# Patient Record
Sex: Female | Born: 1960 | Race: White | Hispanic: No | Marital: Married | Smoking: Former smoker
Health system: Southern US, Community
[De-identification: ages and names within clinical notes are randomized; demographics above are authoritative.]

## PROBLEM LIST (undated history)

## (undated) DIAGNOSIS — J449 Chronic obstructive pulmonary disease, unspecified: Secondary | ICD-10-CM

## (undated) DIAGNOSIS — E7800 Pure hypercholesterolemia, unspecified: Secondary | ICD-10-CM

## (undated) DIAGNOSIS — R599 Enlarged lymph nodes, unspecified: Secondary | ICD-10-CM

## (undated) DIAGNOSIS — I4891 Unspecified atrial fibrillation: Secondary | ICD-10-CM

## (undated) DIAGNOSIS — R918 Other nonspecific abnormal finding of lung field: Secondary | ICD-10-CM

## (undated) DIAGNOSIS — T7840XA Allergy, unspecified, initial encounter: Secondary | ICD-10-CM

## (undated) HISTORY — PX: BREAST SURGERY: SHX581

## (undated) HISTORY — DX: Enlarged lymph nodes, unspecified: R59.9

## (undated) HISTORY — DX: Other nonspecific abnormal finding of lung field: R91.8

## (undated) HISTORY — PX: ABDOMINAL HYSTERECTOMY: SHX81

## (undated) HISTORY — PX: DILATION AND CURETTAGE OF UTERUS: SHX78

## (undated) HISTORY — DX: Unspecified atrial fibrillation: I48.91

## (undated) HISTORY — DX: Pure hypercholesterolemia, unspecified: E78.00

## (undated) HISTORY — DX: Allergy, unspecified, initial encounter: T78.40XA

---

## 1998-08-01 ENCOUNTER — Other Ambulatory Visit: Admission: RE | Admit: 1998-08-01 | Discharge: 1998-08-01 | Payer: Self-pay | Admitting: Family Medicine

## 1998-11-30 ENCOUNTER — Ambulatory Visit (HOSPITAL_COMMUNITY): Admission: RE | Admit: 1998-11-30 | Discharge: 1998-11-30 | Payer: Self-pay | Admitting: Obstetrics & Gynecology

## 2000-09-05 ENCOUNTER — Other Ambulatory Visit: Admission: RE | Admit: 2000-09-05 | Discharge: 2000-09-05 | Payer: Self-pay | Admitting: Obstetrics & Gynecology

## 2001-09-10 ENCOUNTER — Other Ambulatory Visit: Admission: RE | Admit: 2001-09-10 | Discharge: 2001-09-10 | Payer: Self-pay | Admitting: Obstetrics & Gynecology

## 2002-10-08 ENCOUNTER — Other Ambulatory Visit: Admission: RE | Admit: 2002-10-08 | Discharge: 2002-10-08 | Payer: Self-pay | Admitting: Obstetrics & Gynecology

## 2003-10-18 ENCOUNTER — Other Ambulatory Visit: Admission: RE | Admit: 2003-10-18 | Discharge: 2003-10-18 | Payer: Self-pay | Admitting: Obstetrics & Gynecology

## 2006-04-05 ENCOUNTER — Inpatient Hospital Stay (HOSPITAL_COMMUNITY): Admission: AD | Admit: 2006-04-05 | Discharge: 2006-04-06 | Payer: Self-pay | Admitting: Obstetrics & Gynecology

## 2006-04-07 ENCOUNTER — Inpatient Hospital Stay (HOSPITAL_COMMUNITY): Admission: AD | Admit: 2006-04-07 | Discharge: 2006-04-07 | Payer: Self-pay | Admitting: Obstetrics and Gynecology

## 2010-10-31 ENCOUNTER — Other Ambulatory Visit: Payer: Self-pay | Admitting: Obstetrics & Gynecology

## 2012-08-14 ENCOUNTER — Other Ambulatory Visit: Payer: Self-pay | Admitting: Gastroenterology

## 2015-02-23 ENCOUNTER — Telehealth: Payer: Self-pay | Admitting: Acute Care

## 2015-02-23 ENCOUNTER — Encounter: Payer: Self-pay | Admitting: Acute Care

## 2015-02-23 NOTE — Telephone Encounter (Signed)
I called to schedule Ms. Laurie Schmidt for a lung cancer screening at the request of Manon Hilding, PA/ Dr. Hyacinth Meeker at [facility]. There was no answer. I left a message with my contact information asking that she return my call. I will await her return call.

## 2015-02-24 ENCOUNTER — Other Ambulatory Visit: Payer: Self-pay | Admitting: Acute Care

## 2015-02-24 DIAGNOSIS — Z87891 Personal history of nicotine dependence: Secondary | ICD-10-CM

## 2015-02-28 ENCOUNTER — Encounter: Payer: Self-pay | Admitting: Acute Care

## 2015-03-07 ENCOUNTER — Telehealth: Payer: Self-pay | Admitting: Acute Care

## 2015-03-07 ENCOUNTER — Ambulatory Visit (INDEPENDENT_AMBULATORY_CARE_PROVIDER_SITE_OTHER): Payer: Self-pay | Admitting: Acute Care

## 2015-03-07 ENCOUNTER — Encounter: Payer: Self-pay | Admitting: Acute Care

## 2015-03-07 ENCOUNTER — Ambulatory Visit (INDEPENDENT_AMBULATORY_CARE_PROVIDER_SITE_OTHER)
Admission: RE | Admit: 2015-03-07 | Discharge: 2015-03-07 | Disposition: A | Discharge status: Home / Self Care | Payer: Commercial Managed Care - PPO | Source: Ambulatory Visit | Attending: Acute Care | Admitting: Acute Care

## 2015-03-07 DIAGNOSIS — Z87891 Personal history of nicotine dependence: Secondary | ICD-10-CM | POA: Diagnosis not present

## 2015-03-07 NOTE — Progress Notes (Signed)
Shared Decision Making Visit Lung Cancer Screening Program [phone])   Eligibility:  Age 54 y.o.  Pack Years Smoking History Calculation 30+ (# packs/per year x # years smoked)  Recent History of coughing up blood  no  Unexplained weight loss? no ( >Than 15 pounds within the last 6 months )  Prior History Lung / other cancer no (Diagnosis within the last 5 years already requiring surveillance chest CT Scans).  Smoking Status Former Smoker  Former Smokers: Years since quit: < 1 year  Quit Date: 07/12/2014  Visit Components:  Discussion included one or more decision making aids. yes  Discussion included risk/benefits of screening. yes  Discussion included potential follow up diagnostic testing for abnormal scans. yes  Discussion included meaning and risk of over diagnosis. yes  Discussion included meaning and risk of False Positives. yes  Discussion included meaning of total radiation exposure. yes  Counseling Included:  Importance of adherence to annual lung cancer LDCT screening. yes  Impact of comorbidities on ability to participate in the program. yes  Ability and willingness to under diagnostic treatment. yes  Smoking Cessation Counseling:  Current Smokers:   Discussed importance of smoking cessation. NA: Former smoker  Information about tobacco cessation classes and interventions provided to patient. NA  Patient provided with "ticket" for LDCT Scan. NA  Symptomatic Patient. NA  Counseling: NA  Diagnosis Code: Tobacco Use Z72.0  Asymptomatic Patient NA  Counseling NA  Former Smokers:   Discussed the importance of maintaining cigarette abstinence. yes  Diagnosis Code: Personal History of Nicotine Dependence. Q59.563  Information about tobacco cessation classes and interventions provided to patient. Yes  Patient provided with "ticket" for LDCT Scan. yes  Written Order for Lung Cancer Screening with LDCT placed in Epic. Yes (CT Chest Lung Cancer  Screening Low Dose W/O CM) [id number] Z12.2-Screening of respiratory organs Z87.891-Personal history of nicotine dependence  I spent 15 minutes of face to face time with Ms. Laurie Schmidt explaining the risks and benefits of lung cancer screening. We viewed a power point on the risks and benefits of the screening program, stopping at intervals to discuss topics to ensure understanding of all elements noted above. I told her that the single most powerful thing she has done to decrease her risk of lung cancer was to quit smoking. She quit cold [place] after her father died of melanoma in 06/16/14.I told her to call me if she ever had the urge to smoke again so that I can help her to remain smoke free. She verbalized understanding. She verbalized time and location of her appointment for the scan prior to leaving the office and had no further questions for me. I gave her a copy of the power point we viewed together in addition to my card and contact information in the event she has any further questions after she leaves. She verbalized understanding of all of the above prior to leaving the office.   Bevelyn Ngo, NP

## 2015-03-07 NOTE — Telephone Encounter (Signed)
I called to give Laurie Schmidt the results of her lung cancer screening. There was no answer. I have left a message requesting that she return my call with my contact information. I will await her return call.

## 2015-03-08 ENCOUNTER — Telehealth: Payer: Self-pay | Admitting: Acute Care

## 2015-03-08 NOTE — Telephone Encounter (Signed)
Ms. Laurie Schmidt returned my call for the results of her scan. I explained to her that her scan was read as a Lung RADS 2, nodules with a very low likelihood of becoming a clinically active cancer due to lack of size or growth. I explained that the recommendation was for a repeat scan in 12 months.She verbalized understanding and asked appropriate questions. I told her that I will call her in 12 months to schedule her repeat scan. She has my contact information in the event she has any further questions. I will let her PCP know the results of the scan.

## 2015-03-08 NOTE — Telephone Encounter (Signed)
I called again today to give Laurie Schmidt the results of her LDCT. The phone was busy, and I could not leave a message. I will call tomorrow.

## 2015-09-28 ENCOUNTER — Other Ambulatory Visit: Payer: Self-pay | Admitting: Acute Care

## 2015-09-28 DIAGNOSIS — Z87891 Personal history of nicotine dependence: Secondary | ICD-10-CM

## 2016-03-09 ENCOUNTER — Ambulatory Visit (INDEPENDENT_AMBULATORY_CARE_PROVIDER_SITE_OTHER)
Admission: RE | Admit: 2016-03-09 | Discharge: 2016-03-09 | Disposition: A | Discharge status: Home / Self Care | Payer: Commercial Managed Care - PPO | Source: Ambulatory Visit | Attending: Acute Care | Admitting: Acute Care

## 2016-03-09 DIAGNOSIS — Z87891 Personal history of nicotine dependence: Secondary | ICD-10-CM

## 2016-03-14 ENCOUNTER — Telehealth: Payer: Self-pay | Admitting: Acute Care

## 2016-03-14 DIAGNOSIS — Z87891 Personal history of nicotine dependence: Secondary | ICD-10-CM

## 2016-03-14 NOTE — Telephone Encounter (Signed)
Ms. Laurie Schmidt called back for her CT screening results. I explained to her that her scan was read as a Lung RADS 2: nodules that are benign in appearance and behavior with a very low likelihood of becoming a clinically active cancer due to size or lack of growth. Recommendation per radiology is for a repeat LDCT in 12 months. I told her we will schedule her scan in 12 months. She had no further questions upon completing the call, and has my contact information in the event she needs to speak with us at any time.

## 2016-03-14 NOTE — Telephone Encounter (Signed)
I attempted to call Mrs. Laurie Schmidt with the results of her low-dose CT screening. She was unavailable, as she was at work. I did speak with her mother and gave her the number for Laurie Schmidt to call for the results of her scan. As the patient has not listed her mother as a DPA, I was unable to leave the results with her. I have left our number with her mother and her mother will have her call the office at the contact number provided for results of her scan. The patient does not have a cell phone, therefore that is not an option. I will await her return call.

## 2016-04-27 ENCOUNTER — Encounter: Payer: Self-pay | Admitting: Obstetrics & Gynecology

## 2016-12-13 ENCOUNTER — Telehealth: Payer: Self-pay | Admitting: Acute Care

## 2016-12-13 NOTE — Telephone Encounter (Signed)
Per pt request - I left a detailed message and explained that pt will be due for f/u CT on 03/09/17 and we will contact her closer to that time to schedule her.  Nothing further needed.

## 2017-03-18 ENCOUNTER — Ambulatory Visit (INDEPENDENT_AMBULATORY_CARE_PROVIDER_SITE_OTHER)
Admission: RE | Admit: 2017-03-18 | Discharge: 2017-03-18 | Disposition: A | Discharge status: Home / Self Care | Payer: Commercial Managed Care - PPO | Source: Ambulatory Visit | Attending: Acute Care | Admitting: Acute Care

## 2017-03-18 DIAGNOSIS — Z87891 Personal history of nicotine dependence: Secondary | ICD-10-CM

## 2017-03-21 ENCOUNTER — Other Ambulatory Visit: Payer: Self-pay | Admitting: Acute Care

## 2017-03-21 DIAGNOSIS — Z87891 Personal history of nicotine dependence: Secondary | ICD-10-CM

## 2017-03-21 DIAGNOSIS — Z122 Encounter for screening for malignant neoplasm of respiratory organs: Secondary | ICD-10-CM

## 2017-09-27 ENCOUNTER — Ambulatory Visit (INDEPENDENT_AMBULATORY_CARE_PROVIDER_SITE_OTHER): Payer: Commercial Managed Care - PPO | Admitting: Obstetrics & Gynecology

## 2017-09-27 ENCOUNTER — Encounter: Payer: Self-pay | Admitting: Obstetrics & Gynecology

## 2017-09-27 VITALS — BP 128/72 | Ht 70.0 in | Wt 172.8 lb

## 2017-09-27 DIAGNOSIS — Z780 Asymptomatic menopausal state: Secondary | ICD-10-CM | POA: Diagnosis not present

## 2017-09-27 DIAGNOSIS — Z90710 Acquired absence of both cervix and uterus: Secondary | ICD-10-CM

## 2017-09-27 DIAGNOSIS — Z01411 Encounter for gynecological examination (general) (routine) with abnormal findings: Secondary | ICD-10-CM

## 2017-09-27 DIAGNOSIS — Z13820 Encounter for screening for osteoporosis: Secondary | ICD-10-CM | POA: Diagnosis not present

## 2017-09-27 NOTE — Patient Instructions (Signed)
1. Encounter for gynecological examination with abnormal finding Gynecologic exam status post hysterectomy and menopause.  Pap reflex done today.  Breast exam normal status post bilateral augmentation.  Last screening mammography per patient was normal on October 2018.  Colonoscopy 2016.  Health labs with family physician.  2. History of total hysterectomy  3. Menopause present Well on no hormone replacement therapy.  4. Screening for osteoporosis Vitamin D and calcium supplements, calcium rich nutrition and regular weight bearing physical activity recommended.  Schedule bone density here now. - DG Bone Density; Future  Laurie Schmidt, it was a pleasure seeing you today!  I will inform you of your results as soon as they are available.   Health Maintenance for Postmenopausal Women Menopause is a normal process in which your reproductive ability comes to an end. This process happens gradually over a span of months to years, usually between the ages of 58 and 64. Menopause is complete when you have missed 12 consecutive menstrual periods. It is important to talk with your health care provider about some of the most common conditions that affect postmenopausal women, such as heart disease, cancer, and bone loss (osteoporosis). Adopting a healthy lifestyle and getting preventive care can help to promote your health and wellness. Those actions can also lower your chances of developing some of these common conditions. What should I know about menopause? During menopause, you may experience a number of symptoms, such as:  Moderate-to-severe hot flashes.  Night sweats.  Decrease in sex drive.  Mood swings.  Headaches.  Tiredness.  Irritability.  Memory problems.  Insomnia.  Choosing to treat or not to treat menopausal changes is an individual decision that you make with your health care provider. What should I know about hormone replacement therapy and supplements? Hormone therapy products  are effective for treating symptoms that are associated with menopause, such as hot flashes and night sweats. Hormone replacement carries certain risks, especially as you become older. If you are thinking about using estrogen or estrogen with progestin treatments, discuss the benefits and risks with your health care provider. What should I know about heart disease and stroke? Heart disease, heart attack, and stroke become more likely as you age. This may be due, in part, to the hormonal changes that your body experiences during menopause. These can affect how your body processes dietary fats, triglycerides, and cholesterol. Heart attack and stroke are both medical emergencies. There are many things that you can do to help prevent heart disease and stroke:  Have your blood pressure checked at least every 1-2 years. High blood pressure causes heart disease and increases the risk of stroke.  If you are 52-65 years old, ask your health care provider if you should take aspirin to prevent a heart attack or a stroke.  Do not use any tobacco products, including cigarettes, chewing tobacco, or electronic cigarettes. If you need help quitting, ask your health care provider.  It is important to eat a healthy diet and maintain a healthy weight. ? Be sure to include plenty of vegetables, fruits, low-fat dairy products, and lean protein. ? Avoid eating foods that are high in solid fats, added sugars, or salt (sodium).  Get regular exercise. This is one of the most important things that you can do for your health. ? Try to exercise for at least 150 minutes each week. The type of exercise that you do should increase your heart rate and make you sweat. This is known as moderate-intensity exercise. ? Try to  do strengthening exercises at least twice each week. Do these in addition to the moderate-intensity exercise.  Know your numbers.Ask your health care provider to check your cholesterol and your blood glucose.  Continue to have your blood tested as directed by your health care provider.  What should I know about cancer screening? There are several types of cancer. Take the following steps to reduce your risk and to catch any cancer development as early as possible. Breast Cancer  Practice breast self-awareness. ? This means understanding how your breasts normally appear and feel. ? It also means doing regular breast self-exams. Let your health care provider know about any changes, no matter how small.  If you are 39 or older, have a clinician do a breast exam (clinical breast exam or CBE) every year. Depending on your age, family history, and medical history, it may be recommended that you also have a yearly breast X-ray (mammogram).  If you have a family history of breast cancer, talk with your health care provider about genetic screening.  If you are at high risk for breast cancer, talk with your health care provider about having an MRI and a mammogram every year.  Breast cancer (BRCA) gene test is recommended for women who have family members with BRCA-related cancers. Results of the assessment will determine the need for genetic counseling and BRCA1 and for BRCA2 testing. BRCA-related cancers include these types: ? Breast. This occurs in males or females. ? Ovarian. ? Tubal. This may also be called fallopian tube cancer. ? Cancer of the abdominal or pelvic lining (peritoneal cancer). ? Prostate. ? Pancreatic.  Cervical, Uterine, and Ovarian Cancer Your health care provider may recommend that you be screened regularly for cancer of the pelvic organs. These include your ovaries, uterus, and vagina. This screening involves a pelvic exam, which includes checking for microscopic changes to the surface of your cervix (Pap test).  For women ages 21-65, health care providers may recommend a pelvic exam and a Pap test every three years. For women ages 49-65, they may recommend the Pap test and pelvic  exam, combined with testing for human papilloma virus (HPV), every five years. Some types of HPV increase your risk of cervical cancer. Testing for HPV may also be done on women of any age who have unclear Pap test results.  Other health care providers may not recommend any screening for nonpregnant women who are considered low risk for pelvic cancer and have no symptoms. Ask your health care provider if a screening pelvic exam is right for you.  If you have had past treatment for cervical cancer or a condition that could lead to cancer, you need Pap tests and screening for cancer for at least 20 years after your treatment. If Pap tests have been discontinued for you, your risk factors (such as having a new sexual partner) need to be reassessed to determine if you should start having screenings again. Some women have medical problems that increase the chance of getting cervical cancer. In these cases, your health care provider may recommend that you have screening and Pap tests more often.  If you have a family history of uterine cancer or ovarian cancer, talk with your health care provider about genetic screening.  If you have vaginal bleeding after reaching menopause, tell your health care provider.  There are currently no reliable tests available to screen for ovarian cancer.  Lung Cancer Lung cancer screening is recommended for adults 48-24 years old who are at high  risk for lung cancer because of a history of smoking. A yearly low-dose CT scan of the lungs is recommended if you:  Currently smoke.  Have a history of at least 30 pack-years of smoking and you currently smoke or have quit within the past 15 years. A pack-year is smoking an average of one pack of cigarettes per day for one year.  Yearly screening should:  Continue until it has been 15 years since you quit.  Stop if you develop a health problem that would prevent you from having lung cancer treatment.  Colorectal  Cancer  This type of cancer can be detected and can often be prevented.  Routine colorectal cancer screening usually begins at age 58 and continues through age 35.  If you have risk factors for colon cancer, your health care provider may recommend that you be screened at an earlier age.  If you have a family history of colorectal cancer, talk with your health care provider about genetic screening.  Your health care provider may also recommend using home test kits to check for hidden blood in your stool.  A small camera at the end of a tube can be used to examine your colon directly (sigmoidoscopy or colonoscopy). This is done to check for the earliest forms of colorectal cancer.  Direct examination of the colon should be repeated every 5-10 years until age 59. However, if early forms of precancerous polyps or small growths are found or if you have a family history or genetic risk for colorectal cancer, you may need to be screened more often.  Skin Cancer  Check your skin from head to toe regularly.  Monitor any moles. Be sure to tell your health care provider: ? About any new moles or changes in moles, especially if there is a change in a mole's shape or color. ? If you have a mole that is larger than the size of a pencil eraser.  If any of your family members has a history of skin cancer, especially at a young age, talk with your health care provider about genetic screening.  Always use sunscreen. Apply sunscreen liberally and repeatedly throughout the day.  Whenever you are outside, protect yourself by wearing long sleeves, pants, a wide-brimmed hat, and sunglasses.  What should I know about osteoporosis? Osteoporosis is a condition in which bone destruction happens more quickly than new bone creation. After menopause, you may be at an increased risk for osteoporosis. To help prevent osteoporosis or the bone fractures that can happen because of osteoporosis, the following is  recommended:  If you are 35-91 years old, get at least 1,000 mg of calcium and at least 600 mg of vitamin D per day.  If you are older than age 26 but younger than age 42, get at least 1,200 mg of calcium and at least 600 mg of vitamin D per day.  If you are older than age 75, get at least 1,200 mg of calcium and at least 800 mg of vitamin D per day.  Smoking and excessive alcohol intake increase the risk of osteoporosis. Eat foods that are rich in calcium and vitamin D, and do weight-bearing exercises several times each week as directed by your health care provider. What should I know about how menopause affects my mental health? Depression may occur at any age, but it is more common as you become older. Common symptoms of depression include:  Low or sad mood.  Changes in sleep patterns.  Changes in appetite  or eating patterns.  Feeling an overall lack of motivation or enjoyment of activities that you previously enjoyed.  Frequent crying spells.  Talk with your health care provider if you think that you are experiencing depression. What should I know about immunizations? It is important that you get and maintain your immunizations. These include:  Tetanus, diphtheria, and pertussis (Tdap) booster vaccine.  Influenza every year before the flu season begins.  Pneumonia vaccine.  Shingles vaccine.  Your health care provider may also recommend other immunizations. This information is not intended to replace advice given to you by your health care provider. Make sure you discuss any questions you have with your health care provider. Document Released: 08/17/2005 Document Revised: 01/13/2016 Document Reviewed: 03/29/2015 Elsevier Interactive Patient Education  2018 [employer].

## 2017-09-27 NOTE — Progress Notes (Signed)
Laurie Schmidt 12/19/1960 [id number]   History:    57 y.o. G3P1A2L1 Divorced.  Mother lives with her, severe Osteoporosis with Lumbar fracture.  RP:  Established patient presenting for annual gyn exam   HPI: Status post LAVH.  Menopause, well on no hormone replacement therapy.  No pelvic pain.  Normal vaginal secretions.  Abstinent.  Urine and bowel movements normal.  Breasts normal.  Body mass index 24.79.  Enjoys walking regularly.  Taking vitamin D and calcium supplements.  No previous bone density done.  Colonoscopy in 2016.  Health labs with family physician.  Past medical history,surgical history, family history and social history were all reviewed and documented in the EPIC chart.  Gynecologic History No LMP recorded. Patient has had a hysterectomy. Contraception: status post hysterectomy Last Pap: 07/2016. Results were: Negative Last mammogram: 04/2017. Results were: Negative per patient.  Will obtain from [facility] next time. Bone Density: Never Colonoscopy: 2016  Obstetric History OB History  Gravida Para Term Preterm AB Living  3 1     2 1   SAB TAB Ectopic Multiple Live Births               # Outcome Date GA Lbr Len/2nd Weight Sex Delivery Anes PTL Lv  3 AB           2 AB           1 Para              ROS: A ROS was performed and pertinent positives and negatives are included in the history.  GENERAL: No fevers or chills. HEENT: No change in vision, no earache, sore throat or sinus congestion. NECK: No pain or stiffness. CARDIOVASCULAR: No chest pain or pressure. No palpitations. PULMONARY: No shortness of breath, cough or wheeze. GASTROINTESTINAL: No abdominal pain, nausea, vomiting or diarrhea, melena or bright red blood per rectum. GENITOURINARY: No urinary frequency, urgency, hesitancy or dysuria. MUSCULOSKELETAL: No joint or muscle pain, no back pain, no recent trauma. DERMATOLOGIC: No rash, no itching, no lesions. ENDOCRINE: No polyuria, polydipsia, no heat or cold  intolerance. No recent change in weight. HEMATOLOGICAL: No anemia or easy bruising or bleeding. NEUROLOGIC: No headache, seizures, numbness, tingling or weakness. PSYCHIATRIC: No depression, no loss of interest in normal activity or change in sleep pattern.     Exam:   BP 128/72   Ht 5\' 10"  (1.778 m)   Wt 172 lb 12.8 oz (78.4 kg)   BMI 24.79 kg/m   Body mass index is 24.79 kg/m.  General appearance : Well developed well nourished female. No acute distress HEENT: Eyes: no retinal hemorrhage or exudates,  Neck supple, trachea midline, no carotid bruits, no thyroidmegaly Lungs: Clear to auscultation, no rhonchi or wheezes, or rib retractions  Heart: Regular rate and rhythm, no murmurs or gallops Breast: Bilateral breast augmentation.  Examined in sitting and supine position were symmetrical in appearance, no palpable masses or tenderness,  no skin retraction, no nipple inversion, no nipple discharge, no skin discoloration, no axillary or supraclavicular lymphadenopathy Abdomen: no palpable masses or tenderness, no rebound or guarding Extremities: no edema or skin discoloration or tenderness  Pelvic: Vulva: Normal             Vagina: No gross lesions or discharge.  Pap reflex done.  Cervix/Uterus absent  Adnexa  Without masses or tenderness  Anus: Normal   Assessment/Plan:  57 y.o. female for annual exam   1. Encounter for gynecological examination with abnormal  finding Gynecologic exam status post hysterectomy and menopause.  Pap reflex done today.  Breast exam normal status post bilateral augmentation.  Last screening mammography per patient was normal on October 2018.  Colonoscopy 2016.  Health labs with family physician.  2. History of total hysterectomy  3. Menopause present Well on no hormone replacement therapy.  4. Screening for osteoporosis Vitamin D and calcium supplements, calcium rich nutrition and regular weight bearing physical activity recommended.  Schedule bone  density here now. - DG Bone Density; Future  Laurie Del MD, 1:57 PM 09/27/2017

## 2017-09-30 LAB — PAP IG W/ RFLX HPV ASCU

## 2018-01-03 ENCOUNTER — Ambulatory Visit (INDEPENDENT_AMBULATORY_CARE_PROVIDER_SITE_OTHER): Payer: Commercial Managed Care - PPO | Admitting: Obstetrics & Gynecology

## 2018-01-03 ENCOUNTER — Encounter: Payer: Self-pay | Admitting: Obstetrics & Gynecology

## 2018-01-03 VITALS — BP 124/76

## 2018-01-03 DIAGNOSIS — Z113 Encounter for screening for infections with a predominantly sexual mode of transmission: Secondary | ICD-10-CM | POA: Diagnosis not present

## 2018-01-03 DIAGNOSIS — R5382 Chronic fatigue, unspecified: Secondary | ICD-10-CM

## 2018-01-03 DIAGNOSIS — N898 Other specified noninflammatory disorders of vagina: Secondary | ICD-10-CM

## 2018-01-03 DIAGNOSIS — R102 Pelvic and perineal pain: Secondary | ICD-10-CM | POA: Diagnosis not present

## 2018-01-03 DIAGNOSIS — Z13820 Encounter for screening for osteoporosis: Secondary | ICD-10-CM

## 2018-01-03 LAB — WET PREP FOR TRICH, YEAST, CLUE

## 2018-01-03 NOTE — Progress Notes (Signed)
    Laurie Schmidt 01/27/1961 [id number]        57 y.o.  [id number] Divorced.  RP: Increased vaginal discharge with odor x a few weeks  HPI: S/P LAVH.  Abstinent x 3 yrs.  Complains of increased vaginal discharge with odors times a few weeks.  No pelvic pain other than mild tenderness on the left side intermittently.  H/O Lyme disease, continues to feel tired most days.   OB History  Gravida Para Term Preterm AB Living  3 1     2 1   SAB TAB Ectopic Multiple Live Births               # Outcome Date GA Lbr Len/2nd Weight Sex Delivery Anes PTL Lv  3 AB           2 AB           1 Para             Past medical history,surgical history, problem list, medications, allergies, family history and social history were all reviewed and documented in the EPIC chart.   Directed ROS with pertinent positives and negatives documented in the history of present illness/assessment and plan.  Exam:  Vitals:   01/03/18 1246  BP: 124/76   General appearance:  Normal  Abdomen: Normal  Gynecologic exam: Vulva normal.  Speculum:  Vagina normal, but increased discharge.  Wet prep done.  Gono-Chlam done.  Bimanual exam:  Absent Uterus/Cervix.  Tender left adnexa with possible mass.  Wet prep negative   Assessment/Plan:  57 y.o. [id number]   1. Vaginal discharge Wet prep negative, patient reassured.  May use probiotic tablets/suppositories vaginally weekly as needed. - WET PREP FOR TRICH, YEAST, CLUE  2. Screen for STD (sexually transmitted disease) - HIV antibody (with reflex) - RPR - Hepatitis C Antibody - Hepatitis B Surface AntiGEN - C. trachomatis/N. gonorrhoeae RNA  3. Tenderness of female pelvic organs Tender left adnexa with possible mass on gynecologic exam.  Follow-up with a pelvic ultrasound to investigate. - US Transvaginal Non-OB; Future  4. Screening for osteoporosis Vitamin D supplements, calcium rich nutrition and regular weightbearing physical activity recommended F/U Bone  density.  5. Chronic fatigue Patient will follow up with her family physician to investigate, including a repeat test of Lyme disease.   Counseling on above issues and coordination of care more than 50% for 25 minutes.  Laurie DelMarie-Lyne Batya Citron MD, 1:20 PM 01/03/2018

## 2018-01-05 ENCOUNTER — Encounter: Payer: Self-pay | Admitting: Obstetrics & Gynecology

## 2018-01-05 NOTE — Patient Instructions (Signed)
1. Vaginal discharge Wet prep negative, patient reassured.  May use probiotic tablets/suppositories vaginally weekly as needed. - WET PREP FOR TRICH, YEAST, CLUE  2. Screen for STD (sexually transmitted disease) - HIV antibody (with reflex) - RPR - Hepatitis C Antibody - Hepatitis B Surface AntiGEN - C. trachomatis/N. gonorrhoeae RNA  3. Tenderness of female pelvic organs Tender left adnexa with possible mass on gynecologic exam.  Follow-up with a pelvic ultrasound to investigate. - US Transvaginal Non-OB; Future  4. Screening for osteoporosis Vitamin D supplements, calcium rich nutrition and regular weightbearing physical activity recommended F/U Bone density.  5. Chronic fatigue Patient will follow up with her family physician to investigate, including a repeat test of Lyme disease.   Laurie BabinskiMarilyn, it was a pleasure seeing you today!  I will see you soon again at your pelvic ultrasound visit.

## 2018-01-06 LAB — HEPATITIS C ANTIBODY
Hepatitis C Ab: NONREACTIVE
SIGNAL TO CUT-OFF: 0.01 (ref <1.00)

## 2018-01-06 LAB — SYPHILIS: RPR W/REFLEX TO RPR TITER AND TREPONEMAL ANTIBODIES, TRADITIONAL SCREENING AND DIAGNOSIS ALGORITHM: RPR Ser Ql: NONREACTIVE

## 2018-01-06 LAB — C. TRACHOMATIS/N. GONORRHOEAE RNA
C. trachomatis RNA, TMA: NOT DETECTED
N. GONORRHOEAE RNA, TMA: NOT DETECTED

## 2018-01-06 LAB — HEPATITIS B SURFACE ANTIGEN: Hepatitis B Surface Ag: NONREACTIVE

## 2018-01-06 LAB — HIV ANTIBODY (ROUTINE TESTING W REFLEX): HIV 1&2 Ab, 4th Generation: NONREACTIVE

## 2018-01-10 ENCOUNTER — Emergency Department (HOSPITAL_COMMUNITY)
Admission: EM | Admit: 2018-01-10 | Discharge: 2018-01-10 | Disposition: A | Discharge status: Home / Self Care | Payer: Commercial Managed Care - PPO | Attending: Emergency Medicine | Admitting: Emergency Medicine

## 2018-01-10 ENCOUNTER — Encounter (HOSPITAL_COMMUNITY): Payer: Self-pay

## 2018-01-10 ENCOUNTER — Emergency Department (HOSPITAL_COMMUNITY): Payer: Commercial Managed Care - PPO

## 2018-01-10 DIAGNOSIS — L089 Local infection of the skin and subcutaneous tissue, unspecified: Secondary | ICD-10-CM | POA: Diagnosis not present

## 2018-01-10 DIAGNOSIS — Y9389 Activity, other specified: Secondary | ICD-10-CM | POA: Insufficient documentation

## 2018-01-10 DIAGNOSIS — Z23 Encounter for immunization: Secondary | ICD-10-CM | POA: Diagnosis not present

## 2018-01-10 DIAGNOSIS — S60212A Contusion of left wrist, initial encounter: Secondary | ICD-10-CM

## 2018-01-10 DIAGNOSIS — Y92410 Unspecified street and highway as the place of occurrence of the external cause: Secondary | ICD-10-CM | POA: Diagnosis not present

## 2018-01-10 DIAGNOSIS — S6992XA Unspecified injury of left wrist, hand and finger(s), initial encounter: Secondary | ICD-10-CM | POA: Diagnosis present

## 2018-01-10 DIAGNOSIS — Z87891 Personal history of nicotine dependence: Secondary | ICD-10-CM | POA: Diagnosis not present

## 2018-01-10 DIAGNOSIS — Y999 Unspecified external cause status: Secondary | ICD-10-CM | POA: Insufficient documentation

## 2018-01-10 DIAGNOSIS — S60812A Abrasion of left wrist, initial encounter: Secondary | ICD-10-CM

## 2018-01-10 DIAGNOSIS — Z79899 Other long term (current) drug therapy: Secondary | ICD-10-CM | POA: Diagnosis not present

## 2018-01-10 DIAGNOSIS — S50812A Abrasion of left forearm, initial encounter: Secondary | ICD-10-CM | POA: Diagnosis not present

## 2018-01-10 MED ORDER — TETANUS-DIPHTH-ACELL PERTUSSIS 5-2.5-18.5 LF-MCG/0.5 IM SUSP
0.5000 mL | Freq: Once | INTRAMUSCULAR | Status: AC
  Administered 2018-01-10: 0.5 mL via INTRAMUSCULAR
  Filled 2018-01-10: qty 0.5

## 2018-01-10 MED ORDER — BACITRACIN ZINC 500 UNIT/GM EX OINT
1.0000 "application " | TOPICAL_OINTMENT | Freq: Once | CUTANEOUS | Status: AC
  Administered 2018-01-10: 1 via TOPICAL
  Filled 2018-01-10: qty 0.9

## 2018-01-10 NOTE — ED Notes (Signed)
Patient able to speak in complete sentences without any problems.

## 2018-01-10 NOTE — Discharge Instructions (Addendum)
Wear the splint for comfort, apply ice to help with the bruising, take over the counter medications as needed for pain

## 2018-01-10 NOTE — ED Notes (Signed)
Cleaned and irrigated patients wound.
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
## 2018-02-19 ENCOUNTER — Ambulatory Visit: Payer: Commercial Managed Care - PPO | Admitting: Obstetrics & Gynecology

## 2018-02-19 ENCOUNTER — Encounter: Payer: Self-pay | Admitting: Obstetrics & Gynecology

## 2018-02-19 ENCOUNTER — Ambulatory Visit (INDEPENDENT_AMBULATORY_CARE_PROVIDER_SITE_OTHER): Payer: Commercial Managed Care - PPO

## 2018-02-19 DIAGNOSIS — N898 Other specified noninflammatory disorders of vagina: Secondary | ICD-10-CM | POA: Diagnosis not present

## 2018-02-19 DIAGNOSIS — R102 Pelvic and perineal pain: Secondary | ICD-10-CM

## 2018-02-19 LAB — WET PREP FOR TRICH, YEAST, CLUE

## 2018-02-19 MED ORDER — TINIDAZOLE 500 MG PO TABS: 2.0000 g | ORAL_TABLET | Freq: Every day | ORAL | 0 refills | Status: AC

## 2018-02-19 MED ORDER — FLUCONAZOLE 150 MG PO TABS: 150.0000 mg | ORAL_TABLET | Freq: Once | ORAL | 0 refills | Status: AC

## 2018-02-19 NOTE — Progress Notes (Signed)
    [name] 02/01/1961 [id number]        57 y.o.  [id number] Divorced  RP: Left intermittent pelvic pain for Pelvic US  HPI: H/O LAVH.  Menopause on no HRT.  Left intermittent pelvic pain.  Continued increased vaginal discharge, brownish with odor.  Abstinent/not putting anything in vagina.  H/O Colitis per patient, but currently having normal BMs, no rectal bleeding.  Urine normal.  No fever.   OB History  Gravida Para Term Preterm AB Living  3 1     2 1   SAB TAB Ectopic Multiple Live Births               # Outcome Date GA Lbr Len/2nd Weight Sex Delivery Anes PTL Lv  3 AB           2 AB           1 Para             Past medical history,surgical history, problem list, medications, allergies, family history and social history were all reviewed and documented in the EPIC chart.   Directed ROS with pertinent positives and negatives documented in the history of present illness/assessment and plan.  Exam:  There were no vitals filed for this visit. General appearance:  Normal  Pelvic US today: T/V images.  Status post total hysterectomy.  Vaginal cuff negative.  Right ovary with a very small thick-walled cyst measuring 1.0 x 0.9 cm.  Left ovary not seen.  Excessive bowel shadowing in the left adnexa.  No apparent mass in the right or left adnexa.  No free fluid in the cul-de-sac.  Gynecologic exam: Vulva normal.  Speculum: Atrophic vaginitis of menopause.  Mild increase in vaginal discharge.  Wet prep done.  Wet prep:  Clue cells present   Assessment/Plan:  57 y.o. [id number]   1. Left Pelvic pain in female Pelvic ultrasound findings discussed with patient.  Left adnexa showing excessive bowel shadowing, but no cyst or mass.  The left ovary is not seen due to bowel shadowing.  Right ovary with a very small, benign appearing, simple cyst measuring 1 cm.  No free fluid in the cul-de-sac.  Patient reassured.  Recommend modifying her diet to decrease intestinal gas, in particular  she will decrease her intake of beans.  Continue to walk regularly.  If no improvement in her left intermittent pain after a few weeks, will call to organize an MRI of the pelvis.  2. Vaginal discharge Bacterial vaginosis present on wet prep.  Will treat with tinidazole 4 tablets daily for 2 days.  Will follow with 1 tablet of fluconazole to prevent or treat a yeast vaginitis after the antibiotics.  Usage of medication reviewed with patient and prescription sent to pharmacy. - WET PREP FOR TRICH, YEAST, CLUE  Other orders - tinidazole (TINDAMAX) 500 MG tablet; Take 4 tablets (2,000 mg total) by mouth daily for 2 days. - fluconazole (DIFLUCAN) 150 MG tablet; Take 1 tablet (150 mg total) by mouth once for 1 dose.  Counseling on above issues and coordination of care more than 50% for 15 minutes.  Genia DelMarie-Lyne Aws Shere MD, 2:54 PM 02/19/2018

## 2018-02-19 NOTE — Patient Instructions (Signed)
1. Left Pelvic pain in female Pelvic ultrasound findings discussed with patient.  Left adnexa showing excessive bowel shadowing, but no cyst or mass.  The left ovary is not seen due to bowel shadowing.  Right ovary with a very small, benign appearing, simple cyst measuring 1 cm.  No free fluid in the cul-de-sac.  Patient reassured.  Recommend modifying her diet to decrease intestinal gas, in particular she will decrease her intake of beans.  Continue to walk regularly.  If no improvement in her left intermittent pain after a few weeks, will call to organize an MRI of the pelvis.  2. Vaginal discharge Bacterial vaginosis present on wet prep.  Will treat with tinidazole 4 tablets daily for 2 days.  Will follow with 1 tablet of fluconazole to prevent or treat a yeast vaginitis after the antibiotics.  Usage of medication reviewed with patient and prescription sent to pharmacy. - WET PREP FOR TRICH, YEAST, CLUE  Other orders - tinidazole (TINDAMAX) 500 MG tablet; Take 4 tablets (2,000 mg total) by mouth daily for 2 days. - fluconazole (DIFLUCAN) 150 MG tablet; Take 1 tablet (150 mg total) by mouth once for 1 dose.  Jola BabinskiMarilyn, good seeing you today!

## 2018-03-03 ENCOUNTER — Telehealth: Payer: Self-pay | Admitting: *Deleted

## 2018-03-03 NOTE — Telephone Encounter (Signed)
Patient was treated with tinidazole 4 tablets daily for 2 days. And diflucan 150 mg tablet after completing antibiotic. Patient said vaginal discharge is worse, asked if refill could be provided. Please advise

## 2018-03-04 NOTE — Telephone Encounter (Signed)
Given that it is worse, please send Metrogel followed by Terazol 3.  If no improvement after that, will need to come in for a wet prep/evaluation.

## 2018-03-05 MED ORDER — METRONIDAZOLE 0.75 % VA GEL: 1.0000 | Freq: Every day | VAGINAL | 0 refills | Status: DC

## 2018-03-05 MED ORDER — TERCONAZOLE 0.8 % VA CREA: 1.0000 | TOPICAL_CREAM | Freq: Every day | VAGINAL | 0 refills | Status: DC

## 2018-03-05 NOTE — Telephone Encounter (Signed)
Relayed message to patient mother, per DPR access. Per patient request. Rx sent.

## 2018-03-28 ENCOUNTER — Encounter: Payer: Self-pay | Admitting: Obstetrics & Gynecology

## 2018-03-28 ENCOUNTER — Ambulatory Visit (INDEPENDENT_AMBULATORY_CARE_PROVIDER_SITE_OTHER): Payer: Commercial Managed Care - PPO | Admitting: Obstetrics & Gynecology

## 2018-03-28 ENCOUNTER — Ambulatory Visit (INDEPENDENT_AMBULATORY_CARE_PROVIDER_SITE_OTHER)
Admission: RE | Admit: 2018-03-28 | Discharge: 2018-03-28 | Disposition: A | Discharge status: Home / Self Care | Payer: Commercial Managed Care - PPO | Source: Ambulatory Visit | Attending: Acute Care | Admitting: Acute Care

## 2018-03-28 VITALS — BP 126/84

## 2018-03-28 DIAGNOSIS — R1900 Intra-abdominal and pelvic swelling, mass and lump, unspecified site: Secondary | ICD-10-CM | POA: Diagnosis not present

## 2018-03-28 DIAGNOSIS — N898 Other specified noninflammatory disorders of vagina: Secondary | ICD-10-CM | POA: Diagnosis not present

## 2018-03-28 DIAGNOSIS — Z122 Encounter for screening for malignant neoplasm of respiratory organs: Secondary | ICD-10-CM

## 2018-03-28 DIAGNOSIS — Z87891 Personal history of nicotine dependence: Secondary | ICD-10-CM | POA: Diagnosis not present

## 2018-03-28 DIAGNOSIS — R1032 Left lower quadrant pain: Secondary | ICD-10-CM

## 2018-03-28 LAB — WET PREP FOR TRICH, YEAST, CLUE

## 2018-03-28 NOTE — Progress Notes (Signed)
    Laurie DecMarilyn K Schmidt 03/10/1961 [id number]        57 y.o.  [id number]   RP: Continued left lower quadrant pain intermittently and increased vaginal discharge not resolved after BV treatment mid August.  HPI: History of LAVH.  Menopause on no hormone replacement therapy.  Continued left lower quadrant pain intermittently.  Patient has ulcerative colitis followed by gastroenterology.  No change in bowel movements recently and no blood in stools.  Also complains of continued vaginal discharge with odor.  It had improved a little bit after treatment mid August but never completely resolved.  No fever.  Abstinent.   OB History  Gravida Para Term Preterm AB Living  3 1     2 1   SAB TAB Ectopic Multiple Live Births               # Outcome Date GA Lbr Len/2nd Weight Sex Delivery Anes PTL Lv  3 AB           2 AB           1 Para             Past medical history,surgical history, problem list, medications, allergies, family history and social history were all reviewed and documented in the EPIC chart.   Directed ROS with pertinent positives and negatives documented in the history of present illness/assessment and plan.  Exam:  Vitals:   03/28/18 0902  BP: 126/84   General appearance:  Normal  Abdomen: Soft, not distended.  Gynecologic exam: Vulva normal.  Speculum: Vagina normal.  Increased vaginal discharge.  Wet prep done.  Bimanual exam: Mass/dense tissue at left vaginal vault about 4x4 cm and tender.  Otherwise negative bimanual exam.  Wet prep negative   Assessment/Plan:  57 y.o. [id number]   1. LLQ pain Persistent intermittent left lower quadrant pain.  Feeling a mass at the top of the vaginal vault on the left.  This area is tender to palpation.  Possibly scar tissue with adhesions to bowels versus left ovarian mass.  We will proceed with Ca1 25 and CEA today.  Schedule CT scan of the pelvis. - CA 125 - CEA  2. Vaginal discharge Wet prep negative.  Patient recommended  probiotic to maintain a good vaginal flora.  May use probiotic tablets or suppositories vaginally.  3. Pelvic mass in female Pelvic CT scan - CA 125 - CEA  Counseling on above issues and coordination of care more than 50% for 25 minutes.  Genia DelMarie-Lyne Lorean Ekstrand MD, 9:08 AM 03/28/2018

## 2018-03-29 ENCOUNTER — Encounter: Payer: Self-pay | Admitting: Obstetrics & Gynecology

## 2018-03-29 NOTE — Patient Instructions (Signed)
1. LLQ pain Persistent intermittent left lower quadrant pain.  Feeling a mass at the top of the vaginal vault on the left.  This area is tender to palpation.  Possibly scar tissue with adhesions to bowels versus left ovarian mass.  We will proceed with Ca1 25 and CEA today.  Schedule CT scan of the pelvis. - CA 125 - CEA  2. Vaginal discharge Wet prep negative.  Patient recommended probiotic to maintain a good vaginal flora.  May use probiotic tablets or suppositories vaginally.  3. Pelvic mass in female Pelvic CT scan - CA 125 - CEA  Laurie BabinskiMarilyn, good seeing you today!  I will inform you of your results as soon as they are available.

## 2018-03-31 ENCOUNTER — Telehealth: Payer: Self-pay | Admitting: *Deleted

## 2018-03-31 DIAGNOSIS — R1032 Left lower quadrant pain: Secondary | ICD-10-CM

## 2018-03-31 DIAGNOSIS — R1900 Intra-abdominal and pelvic swelling, mass and lump, unspecified site: Secondary | ICD-10-CM

## 2018-03-31 LAB — CA 125: CA 125: 6 U/mL (ref <35)

## 2018-03-31 LAB — CEA: CEA: 2.1 ng/mL

## 2018-03-31 NOTE — Telephone Encounter (Signed)
-----   Message from Genia DelMarie-Lyne Lavoie, MD sent at 03/28/2018  9:21 AM EDT ----- Regarding: Schedule Pelvic CT scan LLQ pain/mass felt on gyn exam at left vaginal vault about 4x4 cm, tender.  Scar tissue? Ovarian mass?

## 2018-03-31 NOTE — Telephone Encounter (Signed)
#   given to patient to call and schedule.

## 2018-03-31 NOTE — Telephone Encounter (Signed)
[facility] I called and confirmed which order to place for the below. Informed CT pelvis with contrast, I called and left message for patient to call me.

## 2018-04-02 NOTE — Telephone Encounter (Signed)
Appointment 04/09/18 @ 3:30pm

## 2018-04-04 ENCOUNTER — Other Ambulatory Visit: Payer: Self-pay | Admitting: Acute Care

## 2018-04-04 DIAGNOSIS — Z122 Encounter for screening for malignant neoplasm of respiratory organs: Secondary | ICD-10-CM

## 2018-04-04 DIAGNOSIS — Z87891 Personal history of nicotine dependence: Secondary | ICD-10-CM

## 2018-04-09 ENCOUNTER — Ambulatory Visit
Admission: RE | Admit: 2018-04-09 | Discharge: 2018-04-09 | Disposition: A | Discharge status: Home / Self Care | Payer: Commercial Managed Care - PPO | Source: Ambulatory Visit | Attending: Obstetrics & Gynecology | Admitting: Obstetrics & Gynecology

## 2018-04-09 DIAGNOSIS — R1032 Left lower quadrant pain: Secondary | ICD-10-CM

## 2018-04-09 DIAGNOSIS — R1900 Intra-abdominal and pelvic swelling, mass and lump, unspecified site: Secondary | ICD-10-CM

## 2018-04-09 MED ORDER — IOPAMIDOL (ISOVUE-300) INJECTION 61%
100.0000 mL | Freq: Once | INTRAVENOUS | Status: AC | PRN
  Administered 2018-04-09: 100 mL via INTRAVENOUS

## 2018-05-14 ENCOUNTER — Encounter: Payer: Self-pay | Admitting: Obstetrics & Gynecology

## 2018-06-23 ENCOUNTER — Ambulatory Visit (INDEPENDENT_AMBULATORY_CARE_PROVIDER_SITE_OTHER): Payer: Commercial Managed Care - PPO

## 2018-06-23 DIAGNOSIS — Z13820 Encounter for screening for osteoporosis: Secondary | ICD-10-CM

## 2018-06-23 DIAGNOSIS — M8588 Other specified disorders of bone density and structure, other site: Secondary | ICD-10-CM | POA: Diagnosis not present

## 2018-06-26 ENCOUNTER — Other Ambulatory Visit: Payer: Self-pay | Admitting: Obstetrics & Gynecology

## 2018-06-26 DIAGNOSIS — Z13820 Encounter for screening for osteoporosis: Secondary | ICD-10-CM

## 2018-06-26 DIAGNOSIS — M8588 Other specified disorders of bone density and structure, other site: Secondary | ICD-10-CM

## 2018-10-03 ENCOUNTER — Encounter: Payer: Commercial Managed Care - PPO | Admitting: Obstetrics & Gynecology

## 2019-03-11 ENCOUNTER — Encounter: Payer: Self-pay | Admitting: Surgery

## 2019-04-09 ENCOUNTER — Other Ambulatory Visit: Payer: Self-pay

## 2019-04-09 DIAGNOSIS — I872 Venous insufficiency (chronic) (peripheral): Secondary | ICD-10-CM

## 2019-04-10 ENCOUNTER — Telehealth (HOSPITAL_COMMUNITY): Payer: Self-pay

## 2019-04-10 NOTE — Telephone Encounter (Signed)
Attempted to contact home number which did not answer or have voicemail.

## 2019-04-13 ENCOUNTER — Ambulatory Visit (HOSPITAL_COMMUNITY)
Admission: RE | Admit: 2019-04-13 | Discharge: 2019-04-13 | Disposition: A | Discharge status: Home / Self Care | Payer: Commercial Managed Care - PPO | Source: Ambulatory Visit | Attending: Family | Admitting: Family

## 2019-04-13 ENCOUNTER — Other Ambulatory Visit: Payer: Self-pay

## 2019-04-13 DIAGNOSIS — I872 Venous insufficiency (chronic) (peripheral): Secondary | ICD-10-CM | POA: Diagnosis not present

## 2019-04-14 ENCOUNTER — Encounter: Payer: Self-pay | Admitting: Vascular Surgery

## 2019-04-14 ENCOUNTER — Other Ambulatory Visit: Payer: Self-pay

## 2019-04-14 ENCOUNTER — Ambulatory Visit (INDEPENDENT_AMBULATORY_CARE_PROVIDER_SITE_OTHER)
Admission: RE | Admit: 2019-04-14 | Discharge: 2019-04-14 | Disposition: A | Discharge status: Home / Self Care | Payer: Commercial Managed Care - PPO | Source: Ambulatory Visit | Attending: Acute Care | Admitting: Acute Care

## 2019-04-14 ENCOUNTER — Ambulatory Visit (INDEPENDENT_AMBULATORY_CARE_PROVIDER_SITE_OTHER): Payer: Commercial Managed Care - PPO | Admitting: Vascular Surgery

## 2019-04-14 ENCOUNTER — Ambulatory Visit (HOSPITAL_COMMUNITY)
Admission: RE | Admit: 2019-04-14 | Discharge: 2019-04-14 | Disposition: A | Discharge status: Home / Self Care | Payer: Commercial Managed Care - PPO | Source: Ambulatory Visit | Attending: Family | Admitting: Family

## 2019-04-14 VITALS — BP 118/62 | HR 63 | Temp 97.8°F | Resp 20 | Ht 70.0 in | Wt 172.0 lb

## 2019-04-14 DIAGNOSIS — I872 Venous insufficiency (chronic) (peripheral): Secondary | ICD-10-CM | POA: Diagnosis not present

## 2019-04-14 DIAGNOSIS — R200 Anesthesia of skin: Secondary | ICD-10-CM | POA: Diagnosis not present

## 2019-04-14 DIAGNOSIS — R202 Paresthesia of skin: Secondary | ICD-10-CM | POA: Diagnosis not present

## 2019-04-14 DIAGNOSIS — Z87891 Personal history of nicotine dependence: Secondary | ICD-10-CM

## 2019-04-14 DIAGNOSIS — Z122 Encounter for screening for malignant neoplasm of respiratory organs: Secondary | ICD-10-CM

## 2019-04-14 NOTE — Progress Notes (Signed)
[facility] [place]  Patient name: Laurie Schmidt MRN: [id number] DOB: 08-18-1960 Sex: female  REASON FOR CONSULT: Evaluation of heaviness, numbness and discomfort in both lower extremities.  HPI: Laurie Schmidt is a 58 y.o. female, who is here today for discussion.  She is here with her sister.  She reports that she is having episodes of heaviness, numbness and tingling and discomfort in both lower extremities.  She does occasionally have mild swelling but this is not a large component of her symptoms.  She reports this is been persistent over the past several months.  She denies any prior history of DVT or lower extremity arterial insufficiency.  He does not have any venous varicosities.  This can be pronounced when she is riding in a car.  Not related to walking.  Past Medical History:  Diagnosis Date  . Allergy     Family History  Problem Relation Age of Onset  . Cancer Father        melanoma  . Breast cancer Paternal Aunt   . Heart attack Maternal Grandmother   . Breast cancer Maternal Grandmother   . Heart attack Paternal Grandmother   . Heart attack Paternal Grandfather     SOCIAL HISTORY: Social History   Socioeconomic History  . Marital status: Single    Spouse name: Not on file  . Number of children: Not on file  . Years of education: Not on file  . Highest education level: Not on file  Occupational History  . Not on file  Social Needs  . Financial resource strain: Not on file  . Food insecurity    Worry: Not on file    Inability: Not on file  . Transportation needs    Medical: Not on file    Non-medical: Not on file  Tobacco Use  . Smoking status: Former Smoker    Packs/day: 1.00    Years: 31.00    Pack years: 31.00    Types: Cigarettes    Quit date: 07/12/2014    Years since quitting: 4.7  . Smokeless tobacco: Never Used  Substance and Sexual Activity  . Alcohol use: Yes    Alcohol/week:  0.0 standard drinks    Comment: glass of wine or 2 with dinner  . Drug use: Never  . Sexual activity: Not Currently    Comment: 1st intercourse-  13,   Lifestyle  . Physical activity    Days per week: Not on file    Minutes per session: Not on file  . Stress: Not on file  Relationships  . Social Herbalist on phone: Not on file    Gets together: Not on file    Attends religious service: Not on file    Active member of club or organization: Not on file    Attends meetings of clubs or organizations: Not on file    Relationship status: Not on file  . Intimate partner violence    Fear of current or ex partner: Not on file    Emotionally abused: Not on file    Physically abused: Not on file    Forced sexual activity: Not on file  Other Topics Concern  . Not on file  Social History Narrative  . Not on file    No Known Allergies  Current Outpatient Medications  Medication Sig Dispense Refill  . Ascorbic Acid (VITAMIN C) 100 MG tablet Take 100 mg by mouth daily.    Marland Kitchen  b complex vitamins tablet Take 1 tablet by mouth daily.    . Calcium Carb-Cholecalciferol (CALCIUM 500+D PO) Take by mouth.    . cholecalciferol (VITAMIN D) 1000 units tablet Take 1,000 Units by mouth daily.    Marland Kitchen. co-enzyme Q-10 30 MG capsule Take 30 mg by mouth 3 (three) times daily.    Marland Kitchen. ibuprofen (ADVIL,MOTRIN) 200 MG tablet Take 200 mg by mouth every 6 (six) hours as needed.    . loratadine (CLARITIN) 10 MG tablet Take 10 mg by mouth daily.    . Red Yeast Rice 600 MG CAPS Take by mouth.     No current facility-administered medications for this visit.     REVIEW OF SYSTEMS:  [X]  denotes positive finding, [ ]  denotes negative finding Cardiac  Comments:  Chest pain or chest pressure:    Shortness of breath upon exertion:    Short of breath when lying flat:    Irregular heart rhythm:        Vascular    Pain in calf, thigh, or hip brought on by ambulation: x   Pain in feet at night that wakes you up  from your sleep:     Blood clot in your veins:    Leg swelling:  x slight      Pulmonary    Oxygen at home:    Productive cough:     Wheezing:         Neurologic    Sudden weakness in arms or legs:  x   Sudden numbness in arms or legs:  x   Sudden onset of difficulty speaking or slurred speech:    Temporary loss of vision in one eye:     Problems with dizziness:         Gastrointestinal    Blood in stool:     Vomited blood:         Genitourinary    Burning when urinating:     Blood in urine:        Psychiatric    Major depression:         Hematologic    Bleeding problems:    Problems with blood clotting too easily:        Skin    Rashes or ulcers:        Constitutional    Fever or chills:      PHYSICAL EXAM: Vitals:   04/14/19 1011  BP: 118/62  Pulse: 63  Resp: 20  Temp: 97.8 F (36.6 C)  SpO2: 100%  Weight: 172 lb (78 kg)  Height: 5\' 10"  (1.778 m)    GENERAL: The patient is a well-nourished female, in no acute distress. The vital signs are documented above. CARDIOVASCULAR: Carotid arteries without bruits bilaterally.  2+ radial and 2+ dorsalis pedis pulses bilaterally.  A few scattered telangiectasia on both lower extremities.  No varicosities and no swelling PULMONARY: There is good air exchange  ABDOMEN: Soft and non-tender  MUSCULOSKELETAL: There are no major deformities or cyanosis. NEUROLOGIC: No focal weakness or paresthesias are detected. SKIN: There are no ulcers or rashes noted. PSYCHIATRIC: The patient has a normal affect.  DATA:  Noninvasive arterial studies reveal normal triphasic waveforms and normal pressures bilaterally  Lower extremity venous studies from yesterday showed no evidence of DVT or obstruction and no evidence of valvular incompetence  MEDICAL ISSUES: I long discussion with the patient and her sister.  I do not see any evidence of arterial or venous pathology.  This may  be neurogenic.  She does not have any strong history  of lumbar disc disease.  She was reassured with this discussion will continue observation only.  May need neurologic work-up if this persists   Larina Earthly, MD [facility] Tel [phone] Pager [phone]

## 2019-04-17 ENCOUNTER — Telehealth: Payer: Self-pay | Admitting: Acute Care

## 2019-04-17 DIAGNOSIS — Z87891 Personal history of nicotine dependence: Secondary | ICD-10-CM

## 2019-04-17 DIAGNOSIS — Z122 Encounter for screening for malignant neoplasm of respiratory organs: Secondary | ICD-10-CM

## 2019-04-20 NOTE — Telephone Encounter (Signed)
LMTC x 1  

## 2019-04-20 NOTE — Telephone Encounter (Signed)
Pt returning call regarding CT scan results  - please return her call at her work number (hung up before I could get it)

## 2019-04-20 NOTE — Telephone Encounter (Signed)
Pt informed of CT results per Sarah Groce, NP.  PT verbalized understanding.  Copy sent to PCP.  Order placed for 1 yr f/u CT.  

## 2019-05-20 ENCOUNTER — Encounter: Payer: Self-pay | Admitting: Obstetrics & Gynecology

## 2019-10-23 ENCOUNTER — Other Ambulatory Visit: Payer: Self-pay

## 2019-10-23 ENCOUNTER — Ambulatory Visit (INDEPENDENT_AMBULATORY_CARE_PROVIDER_SITE_OTHER): Payer: Commercial Managed Care - PPO | Admitting: Obstetrics & Gynecology

## 2019-10-23 ENCOUNTER — Encounter: Payer: Self-pay | Admitting: Obstetrics & Gynecology

## 2019-10-23 VITALS — BP 120/80 | Ht 70.0 in | Wt 165.0 lb

## 2019-10-23 DIAGNOSIS — M8588 Other specified disorders of bone density and structure, other site: Secondary | ICD-10-CM | POA: Diagnosis not present

## 2019-10-23 DIAGNOSIS — Z01419 Encounter for gynecological examination (general) (routine) without abnormal findings: Secondary | ICD-10-CM

## 2019-10-23 DIAGNOSIS — Z780 Asymptomatic menopausal state: Secondary | ICD-10-CM

## 2019-10-23 NOTE — Patient Instructions (Signed)
1. Well female exam with routine gynecological exam Normal gynecologic exam in menopause.  Pap test March 2019 was negative, will repeat a Pap test next year.  Breast exam status post bilateral saline implants normal.  Screening mammogram November 2020 was benign.  Considering breast implant removal.  Good body mass index at 23.68.  Lost about 15 pounds on low calorie diet with intermittent fasting.  Walking regularly and working in the yard.  Health labs with family physician.  Colonoscopy 2016.  2. Postmenopausal Well on no hormone replacement therapy.  No postmenopausal bleeding.  3. Osteopenia of lumbar spine Osteopenia on bone density December 2019.  The only site with osteopenia was at the lumbar spine with a T score of -1.5.  We will repeat a bone density December 2021.  Vitamin D supplements, calcium intake of 1200 mg daily and regular weightbearing physical activities to continue. - DG Bone Density; Future  Laurie Schmidt, it was a pleasure seeing you today!

## 2019-10-23 NOTE — Progress Notes (Signed)
Laurie Schmidt September 11, 1960 [id number]   History:    59 y.o.  G3P1A2L1 Divorced.  Mother lives with her, severe Osteoporosis with Lumbar fracture.  RP:  Established patient presenting for annual gyn exam   HPI: Status post LAVH.  Menopause, well on no hormone replacement therapy.  No pelvic pain.  Normal vaginal secretions.  Abstinent.  Urine and bowel movements normal.  Breasts normal, s/p Bilateral saline implants x 22 yrs, would like to have them removed.  Body mass index 23.68.  Enjoys walking regularly.  Taking vitamin D and calcium supplements.  Bone density 06/2018 Osteopenia Spine -1.5.  Colonoscopy in 2016.  Health labs with family physician.   Past medical history,surgical history, family history and social history were all reviewed and documented in the EPIC chart.  Gynecologic History No LMP recorded. Patient has had a hysterectomy.  Obstetric History OB History  Gravida Para Term Preterm AB Living  3 1     2 1   SAB TAB Ectopic Multiple Live Births               # Outcome Date GA Lbr Len/2nd Weight Sex Delivery Anes PTL Lv  3 AB           2 AB           1 Para              ROS: A ROS was performed and pertinent positives and negatives are included in the history.  GENERAL: No fevers or chills. HEENT: No change in vision, no earache, sore throat or sinus congestion. NECK: No pain or stiffness. CARDIOVASCULAR: No chest pain or pressure. No palpitations. PULMONARY: No shortness of breath, cough or wheeze. GASTROINTESTINAL: No abdominal pain, nausea, vomiting or diarrhea, melena or bright red blood per rectum. GENITOURINARY: No urinary frequency, urgency, hesitancy or dysuria. MUSCULOSKELETAL: No joint or muscle pain, no back pain, no recent trauma. DERMATOLOGIC: No rash, no itching, no lesions. ENDOCRINE: No polyuria, polydipsia, no heat or cold intolerance. No recent change in weight. HEMATOLOGICAL: No anemia or easy bruising or bleeding. NEUROLOGIC: No headache,  seizures, numbness, tingling or weakness. PSYCHIATRIC: No depression, no loss of interest in normal activity or change in sleep pattern.     Exam:   BP 120/80   Ht 5\' 10"  (1.778 m)   Wt 165 lb (74.8 kg)   BMI 23.68 kg/m   Body mass index is 23.68 kg/m.  General appearance : Well developed well nourished female. No acute distress HEENT: Eyes: no retinal hemorrhage or exudates,  Neck supple, trachea midline, no carotid bruits, no thyroidmegaly Lungs: Clear to auscultation, no rhonchi or wheezes, or rib retractions  Heart: Regular rate and rhythm, no murmurs or gallops Breast:Examined in sitting and supine position were symmetrical in appearance, bilateral implants, no palpable masses or tenderness,  no skin retraction, no nipple inversion, no nipple discharge, no skin discoloration, no axillary or supraclavicular lymphadenopathy Abdomen: no palpable masses or tenderness, no rebound or guarding Extremities: no edema or skin discoloration or tenderness  Pelvic: Vulva: Normal             Vagina: No gross lesions or discharge  Cervix: No gross lesions or discharge  Uterus  AV, normal size, shape and consistency, non-tender and mobile  Adnexa  Without masses or tenderness  Anus: Normal   Assessment/Plan:  59 y.o. female for annual exam   1. Well female exam with routine gynecological exam Normal gynecologic exam in menopause.  Pap test March 2019 was negative, will repeat a Pap test next year.  Breast exam status post bilateral saline implants normal.  Screening mammogram November 2020 was benign.  Considering breast implant removal.  Good body mass index at 23.68.  Lost about 15 pounds on low calorie diet with intermittent fasting.  Walking regularly and working in the yard.  Health labs with family physician.  Colonoscopy 2016.  2. Postmenopausal Well on no hormone replacement therapy.  No postmenopausal bleeding.  3. Osteopenia of lumbar spine Osteopenia on bone density December  2019.  The only site with osteopenia was at the lumbar spine with a T score of -1.5.  We will repeat a bone density December 2021.  Vitamin D supplements, calcium intake of 1200 mg daily and regular weightbearing physical activities to continue. - DG Bone Density; Future  Princess Bruins MD, 11:05 AM 10/23/2019

## 2019-10-26 ENCOUNTER — Encounter: Payer: Commercial Managed Care - PPO | Admitting: Obstetrics & Gynecology

## 2019-11-18 ENCOUNTER — Other Ambulatory Visit: Payer: Self-pay

## 2019-11-18 ENCOUNTER — Ambulatory Visit
Admission: EM | Admit: 2019-11-18 | Discharge: 2019-11-18 | Disposition: A | Discharge status: Home / Self Care | Payer: Commercial Managed Care - PPO | Attending: Emergency Medicine | Admitting: Emergency Medicine

## 2019-11-18 DIAGNOSIS — L03012 Cellulitis of left finger: Secondary | ICD-10-CM | POA: Diagnosis not present

## 2019-11-18 DIAGNOSIS — L03011 Cellulitis of right finger: Secondary | ICD-10-CM

## 2019-11-18 MED ORDER — SULFAMETHOXAZOLE-TRIMETHOPRIM 800-160 MG PO TABS: 1.0000 | ORAL_TABLET | Freq: Two times a day (BID) | ORAL | 0 refills | Status: DC

## 2019-11-18 NOTE — ED Provider Notes (Signed)
Renaldo Fiddler    CSN: [id number] Arrival date & time: 11/18/19  1057      History   Chief Complaint Chief Complaint  Patient presents with  . finger problem    HPI Laurie Schmidt is a 59 y.o. female.   Patient presents with painful redness and swelling of her right index finger below her fingernail x 4 days.  No known injuries but patient states she was painting over the weekend.  No open wounds or drainage.  She denies fever, chills, numbness, weakness, paresthesias, or other symptoms.  Tetanus <5 years per patient.  The history is provided by the patient.    Past Medical History:  Diagnosis Date  . Allergy     There are no problems to display for this patient.   Past Surgical History:  Procedure Laterality Date  . ABDOMINAL HYSTERECTOMY    . BREAST SURGERY    . DILATION AND CURETTAGE OF UTERUS      OB History    Gravida  3   Para  1   Term      Preterm      AB  2   Living  1     SAB      TAB      Ectopic      Multiple      Live Births               Home Medications    Prior to Admission medications   Medication Sig Start Date End Date Taking? Authorizing Provider  Ascorbic Acid (VITAMIN C) 100 MG tablet Take 100 mg by mouth daily.    [provider]  b complex vitamins tablet Take 1 tablet by mouth daily.    [provider]  Calcium Carb-Cholecalciferol (CALCIUM 500+D PO) Take by mouth.    [provider]  cholecalciferol (VITAMIN D) 1000 units tablet Take 1,000 Units by mouth daily.    [provider]  co-enzyme Q-10 30 MG capsule Take 30 mg by mouth 3 (three) times daily.    [provider]  ibuprofen (ADVIL,MOTRIN) 200 MG tablet Take 200 mg by mouth every 6 (six) hours as needed.    [provider]  loratadine (CLARITIN) 10 MG tablet Take 10 mg by mouth daily.    [provider]  Red Yeast Rice 600 MG CAPS Take by mouth.    [provider]   sulfamethoxazole-trimethoprim (BACTRIM DS) 800-160 MG tablet Take 1 tablet by mouth 2 (two) times daily for 7 days. 11/18/19 11/25/19  Mickie Bail, NP    Family History Family History  Problem Relation Age of Onset  . Cancer Father        melanoma  . Breast cancer Paternal Aunt   . Heart attack Maternal Grandmother   . Breast cancer Maternal Grandmother   . Heart attack Paternal Grandmother   . Heart attack Paternal Grandfather   . Healthy Mother     Social History Social History   Tobacco Use  . Smoking status: Former Smoker    Packs/day: 1.00    Years: 31.00    Pack years: 31.00    Types: Cigarettes    Quit date: 07/12/2014    Years since quitting: 5.3  . Smokeless tobacco: Never Used  Substance Use Topics  . Alcohol use: Yes    Alcohol/week: 0.0 standard drinks    Comment: glass of wine or 2 with dinner  . Drug use: Never  Allergies   Patient has no known allergies.   Review of Systems Review of Systems  Constitutional: Negative for chills and fever.  HENT: Negative for ear pain and sore throat.   Eyes: Negative for pain and visual disturbance.  Respiratory: Negative for cough and shortness of breath.   Cardiovascular: Negative for chest pain and palpitations.  Gastrointestinal: Negative for abdominal pain and vomiting.  Genitourinary: Negative for dysuria and hematuria.  Musculoskeletal: Negative for arthralgias and back pain.  Skin: Positive for color change. Negative for rash.  Neurological: Negative for seizures, syncope, weakness and numbness.  All other systems reviewed and are negative.    Physical Exam Triage Vital Signs ED Triage Vitals  Enc Vitals Group     BP      Pulse      Resp      Temp      Temp src      SpO2      Weight      Height      Head Circumference      Peak Flow      Pain Score      Pain Loc      Pain Edu?      Excl. in GC?    No data found.  Updated Vital Signs BP (!) 153/95 (BP Location: Right Arm)   Pulse  67   Temp 97.9 F (36.6 C) (Oral)   Resp 18   Wt 165 lb (74.8 kg)   SpO2 98%   BMI 23.68 kg/m   Visual Acuity Right Eye Distance:   Left Eye Distance:   Bilateral Distance:    Right Eye Near:   Left Eye Near:    Bilateral Near:     Physical Exam Vitals and nursing note reviewed.  Constitutional:      General: She is not in acute distress.    Appearance: She is well-developed.  HENT:     Head: Normocephalic and atraumatic.     Mouth/Throat:     Mouth: Mucous membranes are moist.     Pharynx: Oropharynx is clear.  Eyes:     Conjunctiva/sclera: Conjunctivae normal.  Cardiovascular:     Rate and Rhythm: Normal rate and regular rhythm.     Heart sounds: No murmur.  Pulmonary:     Effort: Pulmonary effort is normal. No respiratory distress.     Breath sounds: Normal breath sounds.  Abdominal:     Palpations: Abdomen is soft.     Tenderness: There is no abdominal tenderness.  Musculoskeletal:        General: Swelling present.     Cervical back: Neck supple.  Skin:    General: Skin is warm and dry.     Capillary Refill: Capillary refill takes less than 2 seconds.     Findings: Erythema present.     Comments: Right index finger paronychia with localized cellulitis.  No open wounds, drainage, red streaks.    Neurological:     General: No focal deficit present.     Mental Status: She is alert and oriented to person, place, and time.     Sensory: No sensory deficit.     Motor: No weakness.     Gait: Gait normal.  Psychiatric:        Mood and Affect: Mood normal.        Behavior: Behavior normal.      UC Treatments / Results  Labs (all labs ordered are listed, but only abnormal results are  displayed) Labs Reviewed - No data to display  EKG   Radiology No results found.  Procedures Incision and Drainage  Date/Time: 11/18/2019 11:52 AM Performed by: Sharion Balloon, NP Authorized by: Sharion Balloon, NP   Consent:    Consent obtained:  Verbal   Consent  given by:  Patient   Risks discussed:  Bleeding, incomplete drainage and pain Pre-procedure details:    Skin preparation:  Antiseptic wash Anesthesia (see MAR for exact dosages):    Anesthesia method:  Local infiltration   Local anesthetic:  Lidocaine 1% w/o epi Procedure details:    Incision types:  Stab incision   Scalpel blade:  11   Drainage:  Purulent   Drainage amount:  Moderate   Packing materials:  None Post-procedure details:    Patient tolerance of procedure:  Tolerated well, no immediate complications   (including critical care time)  Medications Ordered in UC Medications - No data to display  Initial Impression / Assessment and Plan / UC Course  I have reviewed the triage vital signs and the nursing notes.  Pertinent labs & imaging results that were available during my care of the patient were reviewed by me and considered in my medical decision making (see chart for details).   Paronychia and cellulitis of right index finger.  I&D performed.  Treating with Septra DS.  Wound care instructions and signs of worsening infection discussed with patient.  Patient reports she is up-to-date on her tetanus.  Instructed her to return here if she see signs of worsening infection.  Patient agrees to plan of care.     Final Clinical Impressions(s) / UC Diagnoses   Final diagnoses:  Paronychia of right index finger  Cellulitis of right index finger     Discharge Instructions     Take the antibiotic as directed.    Keep your wound clean and dry.  Wash it gently twice a day with soap and water.  Apply the antibiotic cream twice a day.    Return here if you see signs of infection, such as increased pain, redness, pus-like drainage, warmth, fever, chills, or other concerning symptoms.       ED Prescriptions    Medication Sig Dispense Auth. Provider   sulfamethoxazole-trimethoprim (BACTRIM DS) 800-160 MG tablet Take 1 tablet by mouth 2 (two) times daily for 7 days. 14  tablet Sharion Balloon, NP     PDMP not reviewed this encounter.   Sharion Balloon, NP 11/18/19 1155

## 2019-11-18 NOTE — Discharge Instructions (Addendum)
Take the antibiotic as directed.    Keep your wound clean and dry.  Wash it gently twice a day with soap and water.  Apply the antibiotic cream twice a day.    Return here if you see signs of infection, such as increased pain, redness, pus-like drainage, warmth, fever, chills, or other concerning symptoms.

## 2019-11-18 NOTE — ED Triage Notes (Signed)
Pt is here with a swollen right hand pointer finger since Sunday. Pt has taken Advil to relieve discomfort.

## 2019-11-23 ENCOUNTER — Encounter (HOSPITAL_COMMUNITY): Payer: Self-pay | Admitting: Orthopedic Surgery

## 2019-11-23 ENCOUNTER — Encounter (HOSPITAL_COMMUNITY)
Admission: AD | Disposition: A | Discharge status: Home / Self Care | Payer: Self-pay | Source: Ambulatory Visit | Attending: Orthopedic Surgery

## 2019-11-23 ENCOUNTER — Ambulatory Visit (HOSPITAL_COMMUNITY): Payer: Commercial Managed Care - PPO | Admitting: Anesthesiology

## 2019-11-23 ENCOUNTER — Other Ambulatory Visit: Payer: Self-pay

## 2019-11-23 ENCOUNTER — Inpatient Hospital Stay (HOSPITAL_COMMUNITY)
Admission: AD | Admit: 2019-11-23 | Discharge: 2019-11-28 | DRG: 982 | Disposition: A | Discharge status: Home / Self Care | Payer: Commercial Managed Care - PPO | Source: Ambulatory Visit | Attending: Orthopedic Surgery | Admitting: Orthopedic Surgery

## 2019-11-23 DIAGNOSIS — Z90710 Acquired absence of both cervix and uterus: Secondary | ICD-10-CM | POA: Diagnosis not present

## 2019-11-23 DIAGNOSIS — L03011 Cellulitis of right finger: Secondary | ICD-10-CM | POA: Diagnosis present

## 2019-11-23 DIAGNOSIS — Z79899 Other long term (current) drug therapy: Secondary | ICD-10-CM

## 2019-11-23 DIAGNOSIS — B954 Other streptococcus as the cause of diseases classified elsewhere: Secondary | ICD-10-CM | POA: Diagnosis present

## 2019-11-23 DIAGNOSIS — M65141 Other infective (teno)synovitis, right hand: Secondary | ICD-10-CM | POA: Diagnosis present

## 2019-11-23 DIAGNOSIS — Z808 Family history of malignant neoplasm of other organs or systems: Secondary | ICD-10-CM

## 2019-11-23 DIAGNOSIS — M65841 Other synovitis and tenosynovitis, right hand: Secondary | ICD-10-CM | POA: Diagnosis present

## 2019-11-23 DIAGNOSIS — L02511 Cutaneous abscess of right hand: Principal | ICD-10-CM | POA: Diagnosis present

## 2019-11-23 DIAGNOSIS — I96 Gangrene, not elsewhere classified: Secondary | ICD-10-CM | POA: Diagnosis present

## 2019-11-23 DIAGNOSIS — Z87891 Personal history of nicotine dependence: Secondary | ICD-10-CM

## 2019-11-23 DIAGNOSIS — Z803 Family history of malignant neoplasm of breast: Secondary | ICD-10-CM

## 2019-11-23 DIAGNOSIS — J449 Chronic obstructive pulmonary disease, unspecified: Secondary | ICD-10-CM | POA: Diagnosis present

## 2019-11-23 DIAGNOSIS — Z20822 Contact with and (suspected) exposure to covid-19: Secondary | ICD-10-CM | POA: Diagnosis present

## 2019-11-23 DIAGNOSIS — Z8249 Family history of ischemic heart disease and other diseases of the circulatory system: Secondary | ICD-10-CM

## 2019-11-23 HISTORY — PX: I & D EXTREMITY: SHX5045

## 2019-11-23 HISTORY — DX: Chronic obstructive pulmonary disease, unspecified: J44.9

## 2019-11-23 LAB — CBC
HCT: 42.5 % (ref 36.0–46.0)
Hemoglobin: 13.9 g/dL (ref 12.0–15.0)
MCH: 33.6 pg (ref 26.0–34.0)
MCHC: 32.7 g/dL (ref 30.0–36.0)
MCV: 102.7 fL — ABNORMAL HIGH (ref 80.0–100.0)
Platelets: 294 10*3/uL (ref 150–400)
RBC: 4.14 MIL/uL (ref 3.87–5.11)
RDW: 12 % (ref 11.5–15.5)
WBC: 8.7 10*3/uL (ref 4.0–10.5)
nRBC: 0 % (ref 0.0–0.2)

## 2019-11-23 LAB — BASIC METABOLIC PANEL
Anion gap: 13 (ref 5–15)
BUN: 8 mg/dL (ref 6–20)
CO2: 21 mmol/L — ABNORMAL LOW (ref 22–32)
Calcium: 9.1 mg/dL (ref 8.9–10.3)
Chloride: 102 mmol/L (ref 98–111)
Creatinine, Ser: 0.82 mg/dL (ref 0.44–1.00)
GFR calc Af Amer: >60 mL/min (ref >60)
GFR calc non Af Amer: >60 mL/min (ref >60)
Glucose, Bld: 94 mg/dL (ref 70–99)
Potassium: 4.7 mmol/L (ref 3.5–5.1)
Sodium: 136 mmol/L (ref 135–145)

## 2019-11-23 LAB — SARS CORONAVIRUS 2 BY RT PCR (HOSPITAL ORDER, PERFORMED IN ~~LOC~~ HOSPITAL LAB): SARS Coronavirus 2: NEGATIVE

## 2019-11-23 LAB — HEMOGLOBIN: Hemoglobin: 14.4 g/dL (ref 12.0–15.0)

## 2019-11-23 SURGERY — IRRIGATION AND DEBRIDEMENT EXTREMITY
Anesthesia: Monitor Anesthesia Care | Laterality: Right

## 2019-11-23 MED ORDER — LACTATED RINGERS IV SOLN
INTRAVENOUS | Status: DC | PRN
Start: 2019-11-23 — End: 2019-11-23

## 2019-11-23 MED ORDER — ASCORBIC ACID 500 MG PO TABS
1000.0000 mg | ORAL_TABLET | Freq: Every day | ORAL | Status: DC
  Administered 2019-11-24 – 2019-11-28 (×4): 1000 mg via ORAL
  Filled 2019-11-23 (×4): qty 2

## 2019-11-23 MED ORDER — PROMETHAZINE HCL 12.5 MG RE SUPP: 12.5000 mg | Freq: Four times a day (QID) | RECTAL | Status: DC | PRN

## 2019-11-23 MED ORDER — FENTANYL CITRATE (PF) 100 MCG/2ML IJ SOLN
INTRAMUSCULAR | Status: AC
  Administered 2019-11-23: 100 ug via INTRAVENOUS
  Filled 2019-11-23: qty 2

## 2019-11-23 MED ORDER — METHOCARBAMOL 500 MG PO TABS
500.0000 mg | ORAL_TABLET | Freq: Four times a day (QID) | ORAL | Status: DC | PRN
  Administered 2019-11-24 – 2019-11-27 (×6): 500 mg via ORAL
  Filled 2019-11-23 (×6): qty 1

## 2019-11-23 MED ORDER — METHOCARBAMOL 1000 MG/10ML IJ SOLN
500.0000 mg | Freq: Four times a day (QID) | INTRAVENOUS | Status: DC | PRN
  Filled 2019-11-23: qty 5

## 2019-11-23 MED ORDER — ROPIVACAINE HCL 7.5 MG/ML IJ SOLN
INTRAMUSCULAR | Status: DC | PRN
  Administered 2019-11-23: 30 mL via PERINEURAL

## 2019-11-23 MED ORDER — FENTANYL CITRATE (PF) 250 MCG/5ML IJ SOLN
INTRAMUSCULAR | Status: AC
  Filled 2019-11-23: qty 5

## 2019-11-23 MED ORDER — ACETAMINOPHEN 500 MG PO TABS
1000.0000 mg | ORAL_TABLET | Freq: Four times a day (QID) | ORAL | Status: AC
  Administered 2019-11-23 – 2019-11-24 (×4): 1000 mg via ORAL
  Filled 2019-11-23 (×4): qty 2

## 2019-11-23 MED ORDER — ACETAMINOPHEN 500 MG PO TABS: 1000.0000 mg | ORAL_TABLET | Freq: Once | ORAL | Status: DC

## 2019-11-23 MED ORDER — OXYCODONE HCL 5 MG PO TABS
5.0000 mg | ORAL_TABLET | ORAL | Status: DC | PRN
  Administered 2019-11-24 (×2): 10 mg via ORAL
  Administered 2019-11-25: 5 mg via ORAL
  Administered 2019-11-25 – 2019-11-28 (×9): 10 mg via ORAL
  Filled 2019-11-23 (×2): qty 2
  Filled 2019-11-23 (×3): qty 1
  Filled 2019-11-23 (×10): qty 2

## 2019-11-23 MED ORDER — ONDANSETRON HCL 4 MG/2ML IJ SOLN: 4.0000 mg | Freq: Four times a day (QID) | INTRAMUSCULAR | Status: DC | PRN

## 2019-11-23 MED ORDER — HYDROMORPHONE HCL 1 MG/ML IJ SOLN
0.5000 mg | INTRAMUSCULAR | Status: DC | PRN
  Administered 2019-11-26: 0.5 mg via INTRAVENOUS
  Filled 2019-11-23: qty 1

## 2019-11-23 MED ORDER — MIDAZOLAM HCL 2 MG/2ML IJ SOLN: 2.0000 mg | Freq: Once | INTRAMUSCULAR | Status: AC

## 2019-11-23 MED ORDER — PIPERACILLIN-TAZOBACTAM 3.375 G IVPB 30 MIN
3.3750 g | Freq: Once | INTRAVENOUS | Status: AC
  Administered 2019-11-23: 3.375 g via INTRAVENOUS
  Filled 2019-11-23: qty 50

## 2019-11-23 MED ORDER — LACTATED RINGERS IV SOLN: INTRAVENOUS | Status: DC

## 2019-11-23 MED ORDER — ONDANSETRON HCL 4 MG PO TABS: 4.0000 mg | ORAL_TABLET | Freq: Four times a day (QID) | ORAL | Status: DC | PRN

## 2019-11-23 MED ORDER — VANCOMYCIN HCL IN DEXTROSE 1-5 GM/200ML-% IV SOLN
INTRAVENOUS | Status: AC
  Filled 2019-11-23: qty 200

## 2019-11-23 MED ORDER — FENTANYL CITRATE (PF) 100 MCG/2ML IJ SOLN: 100.0000 ug | Freq: Once | INTRAMUSCULAR | Status: AC

## 2019-11-23 MED ORDER — SODIUM CHLORIDE 0.9 % IR SOLN
Status: DC | PRN
  Administered 2019-11-23: 3000 mL

## 2019-11-23 MED ORDER — ALPRAZOLAM 0.5 MG PO TABS
0.5000 mg | ORAL_TABLET | Freq: Four times a day (QID) | ORAL | Status: DC | PRN
  Filled 2019-11-23: qty 1

## 2019-11-23 MED ORDER — FENTANYL CITRATE (PF) 100 MCG/2ML IJ SOLN: 25.0000 ug | INTRAMUSCULAR | Status: DC | PRN

## 2019-11-23 MED ORDER — VANCOMYCIN HCL 1000 MG IV SOLR
INTRAVENOUS | Status: DC | PRN
  Administered 2019-11-23: 1000 mg via INTRAVENOUS

## 2019-11-23 MED ORDER — LIDOCAINE HCL 1 % IJ SOLN
INTRAMUSCULAR | Status: DC | PRN
Start: 2019-11-23 — End: 2019-11-23
  Administered 2019-11-23: 100 mg via INTRADERMAL

## 2019-11-23 MED ORDER — OXYCODONE HCL 5 MG PO TABS
10.0000 mg | ORAL_TABLET | ORAL | Status: DC | PRN
  Administered 2019-11-24: 10 mg via ORAL
  Administered 2019-11-24: 15 mg via ORAL
  Administered 2019-11-25 – 2019-11-27 (×4): 10 mg via ORAL
  Filled 2019-11-23: qty 3
  Filled 2019-11-23 (×3): qty 2

## 2019-11-23 MED ORDER — MIDAZOLAM HCL 2 MG/2ML IJ SOLN
INTRAMUSCULAR | Status: AC
  Filled 2019-11-23: qty 2

## 2019-11-23 MED ORDER — PROPOFOL 500 MG/50ML IV EMUL
INTRAVENOUS | Status: DC | PRN
Start: 2019-11-23 — End: 2019-11-23
  Administered 2019-11-23: 150 ug/kg/min via INTRAVENOUS

## 2019-11-23 MED ORDER — ACETAMINOPHEN 325 MG PO TABS: 325.0000 mg | ORAL_TABLET | Freq: Four times a day (QID) | ORAL | Status: DC | PRN

## 2019-11-23 MED ORDER — DOCUSATE SODIUM 100 MG PO CAPS
100.0000 mg | ORAL_CAPSULE | Freq: Two times a day (BID) | ORAL | Status: DC
  Administered 2019-11-24 – 2019-11-27 (×6): 100 mg via ORAL
  Filled 2019-11-23 (×6): qty 1

## 2019-11-23 MED ORDER — MIDAZOLAM HCL 2 MG/2ML IJ SOLN
INTRAMUSCULAR | Status: AC
  Administered 2019-11-23: 2 mg via INTRAVENOUS
  Filled 2019-11-23: qty 2

## 2019-11-23 SURGICAL SUPPLY — 32 items
BNDG ELASTIC 3X5.8 VLCR STR LF (GAUZE/BANDAGES/DRESSINGS) ×2 IMPLANT
BNDG ELASTIC 4X5.8 VLCR STR LF (GAUZE/BANDAGES/DRESSINGS) ×3 IMPLANT
BNDG GAUZE ELAST 4 BULKY (GAUZE/BANDAGES/DRESSINGS) ×5 IMPLANT
CORD BIPOLAR FORCEPS 12FT (ELECTRODE) ×3 IMPLANT
COVER SURGICAL LIGHT HANDLE (MISCELLANEOUS) ×3 IMPLANT
CUFF TOURN SGL QUICK 18X4 (TOURNIQUET CUFF) ×3 IMPLANT
GAUZE SPONGE 4X4 12PLY STRL (GAUZE/BANDAGES/DRESSINGS) ×3 IMPLANT
GAUZE XEROFORM 1X8 LF (GAUZE/BANDAGES/DRESSINGS) ×3 IMPLANT
GLOVE BIOGEL M 8.0 STRL (GLOVE) ×5 IMPLANT
GLOVE SS BIOGEL STRL SZ 8 (GLOVE) ×1 IMPLANT
GLOVE SUPERSENSE BIOGEL SZ 8 (GLOVE) ×2
GOWN STRL REUS W/ TWL LRG LVL3 (GOWN DISPOSABLE) ×1 IMPLANT
GOWN STRL REUS W/ TWL XL LVL3 (GOWN DISPOSABLE) ×2 IMPLANT
GOWN STRL REUS W/TWL LRG LVL3 (GOWN DISPOSABLE) ×3
GOWN STRL REUS W/TWL XL LVL3 (GOWN DISPOSABLE) ×3
KIT BASIN OR (CUSTOM PROCEDURE TRAY) ×3 IMPLANT
KIT TURNOVER KIT B (KITS) ×3 IMPLANT
MANIFOLD NEPTUNE II (INSTRUMENTS) ×3 IMPLANT
NS IRRIG 1000ML POUR BTL (IV SOLUTION) ×3 IMPLANT
PACK ORTHO EXTREMITY (CUSTOM PROCEDURE TRAY) ×3 IMPLANT
PAD ARMBOARD 7.5X6 YLW CONV (MISCELLANEOUS) ×3 IMPLANT
PAD CAST 4YDX4 CTTN HI CHSV (CAST SUPPLIES) ×1 IMPLANT
PADDING CAST COTTON 4X4 STRL (CAST SUPPLIES) ×3
SET CYSTO W/LG BORE CLAMP LF (SET/KITS/TRAYS/PACK) ×3 IMPLANT
SOL PREP POV-IOD 4OZ 10% (MISCELLANEOUS) ×6 IMPLANT
SPLINT FIBERGLASS 3X12 (CAST SUPPLIES) ×2 IMPLANT
SWAB CULTURE ESWAB REG 1ML (MISCELLANEOUS) IMPLANT
TOWEL GREEN STERILE (TOWEL DISPOSABLE) ×3 IMPLANT
TOWEL GREEN STERILE FF (TOWEL DISPOSABLE) ×3 IMPLANT
TUBE CONNECTING 12'X1/4 (SUCTIONS) ×1
TUBE CONNECTING 12X1/4 (SUCTIONS) ×2 IMPLANT
YANKAUER SUCT BULB TIP NO VENT (SUCTIONS) ×3 IMPLANT

## 2019-11-23 NOTE — H&P (Signed)
SMT LOKEY is an 59 y.o. female.   Chief Complaint: Patient presents for evaluation treatment of a right index finger infection. HPI: Patient is a 59 year old female with right index finger infection.  She notes that the infection began May 7.  She has been treated with Bactrim and Keflex.  She is seen outside providers have attempted a irrigation and debridement.  She presented to our acute care clinic today and had worsening symptoms.  This was my first visit with her on an acute emergent basis.  She has an obvious infection.  I feel that this is suspicious for a volar felon and dorsal paronychial type process.  She does not have advance flexor tenosynovitis but does have certainly advancing cellulitic features.  Past Medical History:  Diagnosis Date  . Allergy   . COPD (chronic obstructive pulmonary disease) (HCC)     Past Surgical History:  Procedure Laterality Date  . ABDOMINAL HYSTERECTOMY    . BREAST SURGERY    . DILATION AND CURETTAGE OF UTERUS      Family History  Problem Relation Age of Onset  . Cancer Father        melanoma  . Breast cancer Paternal Aunt   . Heart attack Maternal Grandmother   . Breast cancer Maternal Grandmother   . Heart attack Paternal Grandmother   . Heart attack Paternal Grandfather   . Healthy Mother    Social History:  reports that she quit smoking about 5 years ago. Her smoking use included cigarettes. She has a 31.00 pack-year smoking history. She has never used smokeless tobacco. She reports current alcohol use. She reports that she does not use drugs.  Allergies: No Known Allergies  Medications Prior to Admission  Medication Sig Dispense Refill  . Ascorbic Acid (VITAMIN C) 100 MG tablet Take 100 mg by mouth daily.    Marland Kitchen b complex vitamins tablet Take 1 tablet by mouth daily.    . Calcium Carb-Cholecalciferol (CALCIUM 500+D PO) Take by mouth.    . cephALEXin (KEFLEX) 500 MG capsule Take 500 mg by mouth in the morning, at noon, in  the evening, and at bedtime.    . cholecalciferol (VITAMIN D) 1000 units tablet Take 1,000 Units by mouth daily.    . Coenzyme Q10 (COQ10) 100 MG CAPS Take 1 tablet by mouth daily.    Marland Kitchen HYDROcodone-acetaminophen (NORCO/VICODIN) 5-325 MG tablet Take 1 tablet by mouth every 6 (six) hours as needed for moderate pain.    Marland Kitchen ibuprofen (ADVIL,MOTRIN) 200 MG tablet Take 200 mg by mouth every 6 (six) hours as needed for moderate pain.     Marland Kitchen loratadine (CLARITIN) 10 MG tablet Take 10 mg by mouth daily.    . Red Yeast Rice 600 MG CAPS Take 1 capsule by mouth daily.     Marland Kitchen sulfamethoxazole-trimethoprim (BACTRIM DS) 800-160 MG tablet Take 1 tablet by mouth 2 (two) times daily for 7 days. (Patient not taking: Reported on 11/23/2019) 14 tablet 0    Results for orders placed or performed during the hospital encounter of 11/23/19 (from the past 48 hour(s))  Hemoglobin     Status: None   Collection Time: 11/23/19  4:56 PM  Result Value Ref Range   Hemoglobin 14.4 12.0 - 15.0 g/dL    Comment: Performed at [facility], [address], [place], [place] [address]  SARS Coronavirus 2 by RT PCR (hospital order, performed in [facility] hospital lab) Nasopharyngeal Nasopharyngeal Swab     Status: None  Collection Time: 11/23/19  5:08 PM   Specimen: Nasopharyngeal Swab  Result Value Ref Range   SARS Coronavirus 2 NEGATIVE NEGATIVE    Comment: (NOTE) SARS-CoV-2 target nucleic acids are NOT DETECTED. The SARS-CoV-2 RNA is generally detectable in upper and lower respiratory specimens during the acute phase of infection. The lowest concentration of SARS-CoV-2 viral copies this assay can detect is 250 copies / mL. A negative result does not preclude SARS-CoV-2 infection and should not be used as the sole basis for treatment or other patient management decisions.  A negative result may occur with improper specimen collection / handling, submission of specimen other than nasopharyngeal swab, presence of viral  mutation(s) within the areas targeted by this assay, and inadequate number of viral copies (<250 copies / mL). A negative result must be combined with clinical observations, patient history, and epidemiological information. Fact Sheet for Patients:   BoilerBrush.com.cy Fact Sheet for Healthcare Providers: https://pope.com/ This test is not yet approved or cleared  by the [place] FDA and has been authorized for detection and/or diagnosis of SARS-CoV-2 by FDA under an Emergency Use Authorization (EUA).  This EUA will remain in effect (meaning this test can be used) for the duration of the COVID-19 declaration under Section 564(b)(1) of the Act, 21 U.S.C. section 360bbb-3(b)(1), unless the authorization is terminated or revoked sooner. Performed at [facility], [address] [place], [place] [address]    No results found.  Review of Systems  Respiratory: Negative.   Cardiovascular: Negative.   Gastrointestinal: Negative.   Endocrine: Negative.   Genitourinary: Negative.     Blood pressure (!) 151/80, pulse 78, temperature 98.6 F (37 C), temperature source Oral, resp. rate 16, height 5\' 10"  (1.778 m), weight 73.5 kg, SpO2 99 %. Physical Exam  Infection right index finger with tight volar pulp extending into the mid phalanx level.  The proximal phalanx and flexor tendon sheath is not overly tender.  She has dorsal fluctuance over the eponychial region  The patient is alert and oriented in no acute distress. The patient complains of pain in the affected upper extremity.  The patient is noted to have a normal HEENT exam. Lung fields show equal chest expansion and no shortness of breath. Abdomen exam is nontender without distention. Lower extremity examination does not show any fracture dislocation or blood clot symptoms. Pelvis is stable and the neck and back are stable and nontender. Assessment/Plan We will plan for  irrigation debridement of the finger with repair reconstruction is necessary. We are planning surgery for your upper extremity. The risk and benefits of surgery to include risk of bleeding, infection, anesthesia,  damage to normal structures and failure of the surgery to accomplish its intended goals of relieving symptoms and restoring function have been discussed in detail. With this in mind we plan to proceed. I have specifically discussed with the patient the pre-and postoperative regime and the dos and don'ts and risk and benefits in great detail. Risk and benefits of surgery also include risk of dystrophy(CRPS), chronic nerve pain, failure of the healing process to go onto completion and other inherent risks of surgery The relavent the pathophysiology of the disease/injury process, as well as the alternatives for treatment and postoperative course of action has been discussed in great detail with the patient who desires to proceed.  We will do everything in our power to help you (the patient) restore function to the upper extremity. It is a pleasure to see this patient today.  Willa Frater III, MD 11/23/2019, 6:34 PM

## 2019-11-23 NOTE — Progress Notes (Signed)
Dr. Amanda Pea called to let us know that one finger was a little dusky and he was aware.  The index finger of the RUE is noticeably more dusky than the other fingers on the RUE.  Will notify the receiving RN.

## 2019-11-23 NOTE — Transfer of Care (Signed)
Immediate Anesthesia Transfer of Care Note  Patient: ERTHA NABOR  Procedure(s) Performed: IRRIGATION AND DEBRIDEMENT EXTREMITY, right index finger (Right )  Patient Location: PACU  Anesthesia Type:MAC combined with regional for post-op pain  Level of Consciousness: awake, alert , oriented and patient cooperative  Airway & Oxygen Therapy: Patient Spontanous Breathing  Post-op Assessment: Report given to RN, Post -op Vital signs reviewed and stable and Patient moving all extremities X 4  Post vital signs: Reviewed and stable  Last Vitals:  Vitals Value Taken Time  BP 137/98 11/23/19 2115  Temp    Pulse 91 11/23/19 2115  Resp 16 11/23/19 2116  SpO2    Vitals shown include unvalidated device data.  Last Pain:  Vitals:   11/23/19 1657  TempSrc: Oral  PainSc: 9       Patients Stated Pain Goal: 4 (56/15/37 9432)  Complications: No apparent anesthesia complications

## 2019-11-23 NOTE — Anesthesia Procedure Notes (Signed)
Procedure Name: MAC Date/Time: 11/23/2019 8:23 PM Performed by: Claris Che, CRNA Pre-anesthesia Checklist: Patient identified, Emergency Drugs available, Suction available, Patient being monitored and Timeout performed Patient Re-evaluated:Patient Re-evaluated prior to induction Oxygen Delivery Method: Simple face mask

## 2019-11-23 NOTE — Anesthesia Preprocedure Evaluation (Addendum)
Anesthesia Evaluation  Patient identified by MRN, date of birth, ID band Patient awake    Reviewed: Allergy & Precautions, NPO status , Patient's Chart, lab work & pertinent test results  Airway Mallampati: II  TM Distance: >3 FB Neck ROM: Full    Dental  (+) Teeth Intact, Dental Advisory Given   Pulmonary COPD, former smoker,    Pulmonary exam normal breath sounds clear to auscultation       Cardiovascular negative cardio ROS Normal cardiovascular exam Rhythm:Regular Rate:Normal     Neuro/Psych negative neurological ROS  negative psych ROS   GI/Hepatic negative GI ROS, Neg liver ROS,   Endo/Other  negative endocrine ROS  Renal/GU negative Renal ROS  negative genitourinary   Musculoskeletal negative musculoskeletal ROS (+)   Abdominal   Peds  Hematology negative hematology ROS (+)   Anesthesia Other Findings   Reproductive/Obstetrics                           Anesthesia Physical Anesthesia Plan  ASA: II  Anesthesia Plan: Regional and MAC   Post-op Pain Management:    Induction: Intravenous  PONV Risk Score and Plan: 2 and Propofol infusion and Treatment may vary due to age or medical condition  Airway Management Planned: Nasal Cannula and Natural Airway  Additional Equipment:   Intra-op Plan:   Post-operative Plan:   Informed Consent: I have reviewed the patients History and Physical, chart, labs and discussed the procedure including the risks, benefits and alternatives for the proposed anesthesia with the patient or authorized representative who has indicated his/her understanding and acceptance.     Dental advisory given  Plan Discussed with: CRNA  Anesthesia Plan Comments:         Anesthesia Quick Evaluation

## 2019-11-23 NOTE — Plan of Care (Signed)

## 2019-11-23 NOTE — Anesthesia Postprocedure Evaluation (Signed)
Anesthesia Post Note  Patient: Laurie Schmidt  Procedure(s) Performed: IRRIGATION AND DEBRIDEMENT EXTREMITY, right index finger (Right )     Patient location during evaluation: PACU Anesthesia Type: Regional Level of consciousness: awake and alert Pain management: pain level controlled Vital Signs Assessment: post-procedure vital signs reviewed and stable Respiratory status: spontaneous breathing, nonlabored ventilation, respiratory function stable and patient connected to nasal cannula oxygen Cardiovascular status: stable and blood pressure returned to baseline Postop Assessment: no apparent nausea or vomiting Anesthetic complications: no    Last Vitals:  Vitals:   11/23/19 2130 11/23/19 2145  BP: (!) 141/82 134/84  Pulse: 82   Resp: 19 14  Temp:    SpO2: 94% 94%    Last Pain:  Vitals:   11/23/19 1657  TempSrc: Oral  PainSc: 9                  Cecile Hearing

## 2019-11-23 NOTE — Op Note (Signed)
Operative note Nov 23, 2019  Laurie Kaufman MD  Preoperative diagnosis right index finger infection significant nature with early flexor sheath infection and loss of function  Postop diagnosis the same  Operative procedure #1 irrigation debridement deep complicated abscess right index finger #2 flexor tendon tenolysis tenosynovectomy about the flexor digitorum profundus due to paralytic tenosynovitis  Surgeon Laurie Schmidt  Anesthesia block with IV sedation  Estimated blood loss minimal  Cultures x2 taken  Tourniquet time less than 30 minutes  Indications for the procedure this patient is complicated infection.  She is 59 years of age.  She has had the infection for greater than 10 days and has significant loss of function.  Unfortunately the tip of her fingers had previous injection for anesthetic and a I&D which she describes as days ago through an urgent care.  The tip of her finger does look a little unusual in terms of its refill and there is de-epithelialized skin about it.  She appears to have a significant volar infection with an abscess pointing.  Operative procedure in detail patient was seen by myself and anesthesia.  A block was placed.  Consent signed and are marked.  Timeout was observed and she underwent a preop scrub with Hibiclens followed by 10-minute surgical Betadine scrub and paint.  Once this was complete the patient then underwent a very careful and cautious approach to the finger with elevation of the tourniquet followed by evaluation of the skin architecture.  Dorsally she looked fairly stable.  Volarly she had a significant abnormality about the skin at the DIP region coursing proximal and distal with ascending cellulitis.  I made an incision modified Alyse Low nature immediately I encountered a deep abscess.  This was deep complicated abscess consistent with a felon.  Aerobic and anaerobic cultures were taken at this time.  Following this I then dissected deeper  and performed a very careful and cautious approach to the flexor tendon.  The flexor digitorum profundus had notable abnormality and infectious debris.  I open the area distal and proximal A4 and performed a tenolysis tenosynovectomy.  The patient tolerated this nicely following the tenolysis tenosynovectomy is very careful to preserve the radial and ulnar digital nerves at the trifurcation level.  At this juncture I deflated the tourniquet removed de-epithelialized skin and evaluated the finger.  The infection has been drained appropriately.  There is a large amount of necrotic debris which we removed.  I was very careful with this process.  I then irrigated with 3 L of saline followed by leaving the wound wide open and dressing her with Xeroform gauze and allowing the area to drain appropriately over the next 24 to 48 hours.  The patient tolerated this well.  There were no complicating features.  The tip of her finger is concerning in terms of its viability and my question due to the fact that she had such significant abnormalities present and given the tissue necrosis and timeframe duration from initiation of the process to surgical management.  We will have to watch this closely as I do feel she has a tip at risk.  Hopefully this will improve with time and she does not have any significant visual loss.  Nevertheless I do feel this is a real concern and the patient understands this.  We discussed relevant issues findings concerns and other aspects of the care.  Should be admitted on my service for IV antibiotics including vancomycin and Zosyn.  All questions have been addressed.  Following  the procedure I called her sister and left a detailed voicemail but was unable to contact the family thus left word via voicemail and leftward for the patient in the recovery region.  Laurie Fretwell MD

## 2019-11-23 NOTE — Anesthesia Procedure Notes (Signed)
Anesthesia Regional Block: Axillary brachial plexus block   Pre-Anesthetic Checklist: ,, timeout performed, Correct Patient, Correct Site, Correct Laterality, Correct Procedure, Correct Position, site marked, Risks and benefits discussed,  Surgical consent,  Pre-op evaluation,  At surgeon's request and post-op pain management  Laterality: Right  Prep: chloraprep       Needles:  Injection technique: Single-shot  Needle Type: Echogenic Needle     Needle Length: 9cm  Needle Gauge: 21     Additional Needles:   Procedures:,,,, ultrasound used (permanent image in chart),,,,  Narrative:  Start time: 11/23/2019 7:40 PM End time: 11/23/2019 7:50 PM Injection made incrementally with aspirations every 5 mL.  Performed by: Personally  Anesthesiologist: Cecile Hearing, MD  Additional Notes: No pain on injection. No increased resistance to injection. Injection made in 5cc increments.  Good needle visualization.  Patient tolerated procedure well.

## 2019-11-23 NOTE — Progress Notes (Addendum)
Pharmacy Antibiotic Note  Laurie Schmidt is a 59 y.o. female admitted on 11/23/2019 with a R-index finger infection now s/p I&D with cultures sent. Pharmacy has been consulted for Vancomycin + Zosyn dosing.  The patient was noted to be on Bactrim + Keflex PTA.   The patient received Vancomycin 1g intra-op at 2047.  Plan: - No Vancomycin for now while awaiting labs - Zosyn 3.375g IV x 1 - Will order labs and check out to the next shift to address additional doses.   Height: 5\' 10"  (177.8 cm) Weight: 73.5 kg (162 lb) IBW/kg (Calculated) : 68.5  Temp (24hrs), Avg:98.1 F (36.7 C), Min:97.7 F (36.5 C), Max:98.6 F (37 C)  No results for input(s): WBC, CREATININE, LATICACIDVEN, VANCOTROUGH, VANCOPEAK, VANCORANDOM, GENTTROUGH, GENTPEAK, GENTRANDOM, TOBRATROUGH, TOBRAPEAK, TOBRARND, AMIKACINPEAK, AMIKACINTROU, AMIKACIN in the last 168 hours.  CrCl cannot be calculated (No successful lab value found.).    No Known Allergies  Antimicrobials this admission: Vanc 5/17 >> Zosy 5/17 >>  Dose adjustments this admission: n/a  Microbiology results: 5/17 R-index finger tissue cx >> 5/17 COVID >> neg  Thank you for allowing pharmacy to be a part of this patient's care.  6/17, PharmD, BCPS Clinical Pharmacist Clinical phone for 11/23/2019: 11/25/2019 11/23/2019 10:33 PM   **Pharmacist phone directory can now be found on [email or web address] ([email or web address]).  Listed under [facility].  Addum:  SrCr 0.82, CrCl ~78 ml/min  Will cont zosyn 3.375 gm IV q8.  Cont vanc 750 q12-Ozell Ferrera, PharmD

## 2019-11-24 MED ORDER — PIPERACILLIN-TAZOBACTAM 3.375 G IVPB
3.3750 g | Freq: Three times a day (TID) | INTRAVENOUS | Status: DC
  Administered 2019-11-24 – 2019-11-27 (×10): 3.375 g via INTRAVENOUS
  Filled 2019-11-24 (×10): qty 50

## 2019-11-24 MED ORDER — VANCOMYCIN HCL 750 MG/150ML IV SOLN
750.0000 mg | Freq: Two times a day (BID) | INTRAVENOUS | Status: DC
  Administered 2019-11-24 – 2019-11-27 (×8): 750 mg via INTRAVENOUS
  Filled 2019-11-24 (×10): qty 150

## 2019-11-24 NOTE — Plan of Care (Signed)
  Problem: Clinical Measurements: Goal: Ability to maintain clinical measurements within normal limits will improve Outcome: Progressing Goal: Will remain free from infection Outcome: Progressing Goal: Diagnostic test results will improve Outcome: Progressing Goal: Respiratory complications will improve Outcome: Progressing Goal: Cardiovascular complication will be avoided Outcome: Progressing   Problem: Nutrition: Goal: Adequate nutrition will be maintained Outcome: Progressing   Problem: Pain Managment: Goal: General experience of comfort will improve Outcome: Progressing   Problem: Safety: Goal: Ability to remain free from injury will improve Outcome: Progressing   Problem: Skin Integrity: Goal: Risk for impaired skin integrity will decrease Outcome: Progressing   

## 2019-11-24 NOTE — Progress Notes (Signed)
Patient ID: Laurie Schmidt, female   DOB: 12/16/1960, 59 y.o.   MRN: [id number] Patient is seen and evaluated.  Patient is doing very well.  I discussed gust with the patient the findings last night of surgery.  We will await cultures.  She is voiding well and tolerating her diet.  The fingertip is pink and viable.  We will plan for repeat irrigation debridement and repair reconstruction is necessary with possible rotation flap tomorrow.  All questions have been addressed.  We have discussed with the patient the issues regarding their infection to the extremity. We will continue antibiotics and await culture results. Often times it will take 3-5 days for cultures to become final. During this time we will typically have the patient on intravenous antibiotics until we can find a parenteral route of antibiotic regime specific for the bacteria or organism isolated. We have discussed with the patient the need for daily irrigation and debridement as well as therapy to the area. We have discussed with the patient the necessity of range of motion to the involved joints as discussed today. We have discussed with the patient the unpredictability of infections at times. We'll continue to work towards good pain control and restoration of function. The patient understands the need for meticulous wound care and the necessity of proper followup.  The possible complications of stiffness (loss of motion), resistant infection, possible deep bone infection, possible chronic pain issues, possible need for multiple surgeries and even amputation.  With this in mind the patient understands our goal is to eradicate the infection to quiesence. We will continue to work towards these goals.  Chest is clear.  Heart regular rate.  Abdomen is nontender.  No signs of DVT or complication.  We will continue aggressive efforts to try and rehabilitate her hand given the severity of her infection.  [name] MD

## 2019-11-24 NOTE — Plan of Care (Signed)
  Problem: Education: Goal: Knowledge of General Education information will improve Description: Including pain rating scale, medication(s)/side effects and non-pharmacologic comfort measures Outcome: Progressing   Problem: Health Behavior/Discharge Planning: Goal: Ability to manage health-related needs will improve Outcome: Progressing   Problem: Activity: Goal: Risk for activity intolerance will decrease Outcome: Progressing   

## 2019-11-25 ENCOUNTER — Encounter (HOSPITAL_COMMUNITY)
Admission: AD | Disposition: A | Discharge status: Home / Self Care | Payer: Self-pay | Source: Ambulatory Visit | Attending: Orthopedic Surgery

## 2019-11-25 ENCOUNTER — Encounter (HOSPITAL_COMMUNITY): Payer: Self-pay | Admitting: Orthopedic Surgery

## 2019-11-25 ENCOUNTER — Inpatient Hospital Stay (HOSPITAL_COMMUNITY): Payer: Commercial Managed Care - PPO | Admitting: Anesthesiology

## 2019-11-25 HISTORY — PX: I & D EXTREMITY: SHX5045

## 2019-11-25 SURGERY — IRRIGATION AND DEBRIDEMENT EXTREMITY
Anesthesia: Monitor Anesthesia Care | Site: Finger | Laterality: Right

## 2019-11-25 MED ORDER — FENTANYL CITRATE (PF) 100 MCG/2ML IJ SOLN
INTRAMUSCULAR | Status: AC
  Administered 2019-11-25: 50 ug via INTRAVENOUS
  Filled 2019-11-25: qty 2

## 2019-11-25 MED ORDER — ONDANSETRON HCL 4 MG/2ML IJ SOLN: 4.0000 mg | Freq: Once | INTRAMUSCULAR | Status: DC | PRN

## 2019-11-25 MED ORDER — FENTANYL CITRATE (PF) 250 MCG/5ML IJ SOLN
INTRAMUSCULAR | Status: AC
  Filled 2019-11-25: qty 5

## 2019-11-25 MED ORDER — ONDANSETRON HCL 4 MG/2ML IJ SOLN
INTRAMUSCULAR | Status: AC
  Filled 2019-11-25: qty 2

## 2019-11-25 MED ORDER — BUPIVACAINE HCL (PF) 0.25 % IJ SOLN
INTRAMUSCULAR | Status: AC
  Filled 2019-11-25: qty 30

## 2019-11-25 MED ORDER — MIDAZOLAM HCL 2 MG/2ML IJ SOLN: 2.0000 mg | Freq: Once | INTRAMUSCULAR | Status: AC

## 2019-11-25 MED ORDER — SODIUM CHLORIDE 0.9 % IR SOLN
Status: DC | PRN
  Administered 2019-11-25: 3000 mL

## 2019-11-25 MED ORDER — ROPIVACAINE HCL 7.5 MG/ML IJ SOLN
INTRAMUSCULAR | Status: DC | PRN
Start: 2019-11-25 — End: 2019-11-25
  Administered 2019-11-25: 30 mL via PERINEURAL

## 2019-11-25 MED ORDER — MIDAZOLAM HCL 2 MG/2ML IJ SOLN
INTRAMUSCULAR | Status: AC
  Administered 2019-11-25: 2 mg via INTRAVENOUS
  Filled 2019-11-25: qty 2

## 2019-11-25 MED ORDER — PROPOFOL 10 MG/ML IV BOLUS
INTRAVENOUS | Status: AC
  Filled 2019-11-25: qty 20

## 2019-11-25 MED ORDER — FENTANYL CITRATE (PF) 100 MCG/2ML IJ SOLN: 25.0000 ug | INTRAMUSCULAR | Status: DC | PRN

## 2019-11-25 MED ORDER — PHENYLEPHRINE 40 MCG/ML (10ML) SYRINGE FOR IV PUSH (FOR BLOOD PRESSURE SUPPORT)
PREFILLED_SYRINGE | INTRAVENOUS | Status: AC
  Filled 2019-11-25: qty 10

## 2019-11-25 MED ORDER — FENTANYL CITRATE (PF) 100 MCG/2ML IJ SOLN: 50.0000 ug | Freq: Once | INTRAMUSCULAR | Status: AC

## 2019-11-25 MED ORDER — MIDAZOLAM HCL 2 MG/2ML IJ SOLN
INTRAMUSCULAR | Status: AC
  Filled 2019-11-25: qty 2

## 2019-11-25 MED ORDER — MIDAZOLAM HCL 2 MG/2ML IJ SOLN
INTRAMUSCULAR | Status: DC | PRN
  Administered 2019-11-25: 2 mg via INTRAVENOUS

## 2019-11-25 MED ORDER — PROPOFOL 500 MG/50ML IV EMUL
INTRAVENOUS | Status: DC | PRN
  Administered 2019-11-25: 125 ug/kg/min via INTRAVENOUS

## 2019-11-25 MED ORDER — IRRISEPT - 450ML BOTTLE WITH 0.05% CHG IN STERILE WATER, USP 99.95% OPTIME
TOPICAL | Status: DC | PRN
  Administered 2019-11-25: 450 mL

## 2019-11-25 MED ORDER — 0.9 % SODIUM CHLORIDE (POUR BTL) OPTIME
TOPICAL | Status: DC | PRN
  Administered 2019-11-25: 1000 mL

## 2019-11-25 MED ORDER — LACTATED RINGERS IV SOLN: INTRAVENOUS | Status: DC | PRN

## 2019-11-25 MED ORDER — ONDANSETRON HCL 4 MG/2ML IJ SOLN
INTRAMUSCULAR | Status: DC | PRN
  Administered 2019-11-25: 4 mg via INTRAVENOUS

## 2019-11-25 MED ORDER — PHENYLEPHRINE HCL (PRESSORS) 10 MG/ML IV SOLN
INTRAVENOUS | Status: DC | PRN
  Administered 2019-11-25: 80 ug via INTRAVENOUS

## 2019-11-25 SURGICAL SUPPLY — 45 items
BNDG CONFORM 2 STRL LF (GAUZE/BANDAGES/DRESSINGS) IMPLANT
BNDG ELASTIC 4X5.8 VLCR STR LF (GAUZE/BANDAGES/DRESSINGS) ×3 IMPLANT
BNDG GAUZE ELAST 4 BULKY (GAUZE/BANDAGES/DRESSINGS) ×5 IMPLANT
CORD BIPOLAR FORCEPS 12FT (ELECTRODE) ×3 IMPLANT
COVER SURGICAL LIGHT HANDLE (MISCELLANEOUS) ×3 IMPLANT
COVER WAND RF STERILE (DRAPES) ×3 IMPLANT
CUFF TOURN SGL QUICK 18X4 (TOURNIQUET CUFF) ×3 IMPLANT
CUFF TOURN SGL QUICK 24 (TOURNIQUET CUFF)
CUFF TRNQT CYL 24X4X16.5-23 (TOURNIQUET CUFF) IMPLANT
DRSG ADAPTIC 3X8 NADH LF (GAUZE/BANDAGES/DRESSINGS) ×3 IMPLANT
DRSG XEROFORM 1X8 (GAUZE/BANDAGES/DRESSINGS) ×2 IMPLANT
GAUZE SPONGE 4X4 12PLY STRL (GAUZE/BANDAGES/DRESSINGS) ×3 IMPLANT
GAUZE XEROFORM 1X8 LF (GAUZE/BANDAGES/DRESSINGS) ×3 IMPLANT
GLOVE BIOGEL M 8.0 STRL (GLOVE) ×3 IMPLANT
GLOVE SS BIOGEL STRL SZ 8 (GLOVE) ×1 IMPLANT
GLOVE SUPERSENSE BIOGEL SZ 8 (GLOVE) ×2
GOWN STRL REUS W/ TWL LRG LVL3 (GOWN DISPOSABLE) ×1 IMPLANT
GOWN STRL REUS W/ TWL XL LVL3 (GOWN DISPOSABLE) ×2 IMPLANT
GOWN STRL REUS W/TWL LRG LVL3 (GOWN DISPOSABLE) ×3
GOWN STRL REUS W/TWL XL LVL3 (GOWN DISPOSABLE) ×6
JET LAVAGE IRRISEPT WOUND (IRRIGATION / IRRIGATOR) ×3
KIT BASIN OR (CUSTOM PROCEDURE TRAY) ×3 IMPLANT
KIT TURNOVER KIT B (KITS) ×3 IMPLANT
LAVAGE JET IRRISEPT WOUND (IRRIGATION / IRRIGATOR) IMPLANT
MANIFOLD NEPTUNE II (INSTRUMENTS) ×3 IMPLANT
NDL HYPO 25GX1X1/2 BEV (NEEDLE) IMPLANT
NEEDLE HYPO 25GX1X1/2 BEV (NEEDLE) IMPLANT
NS IRRIG 1000ML POUR BTL (IV SOLUTION) ×3 IMPLANT
PACK ORTHO EXTREMITY (CUSTOM PROCEDURE TRAY) ×3 IMPLANT
PAD ARMBOARD 7.5X6 YLW CONV (MISCELLANEOUS) ×3 IMPLANT
PAD CAST 4YDX4 CTTN HI CHSV (CAST SUPPLIES) ×1 IMPLANT
PADDING CAST COTTON 4X4 STRL (CAST SUPPLIES) ×3
SET CYSTO W/LG BORE CLAMP LF (SET/KITS/TRAYS/PACK) ×3 IMPLANT
SLING ARM FOAM STRAP LRG (SOFTGOODS) ×2 IMPLANT
SOL PREP POV-IOD 4OZ 10% (MISCELLANEOUS) ×6 IMPLANT
SPONGE LAP 4X18 RFD (DISPOSABLE) ×3 IMPLANT
SUT PROLENE 5 0 PS 2 (SUTURE) ×2 IMPLANT
SWAB CULTURE ESWAB REG 1ML (MISCELLANEOUS) IMPLANT
SYR CONTROL 10ML LL (SYRINGE) IMPLANT
TOWEL GREEN STERILE (TOWEL DISPOSABLE) ×3 IMPLANT
TOWEL GREEN STERILE FF (TOWEL DISPOSABLE) ×3 IMPLANT
TUBE CONNECTING 12'X1/4 (SUCTIONS) ×1
TUBE CONNECTING 12X1/4 (SUCTIONS) ×2 IMPLANT
WATER STERILE IRR 1000ML POUR (IV SOLUTION) ×3 IMPLANT
YANKAUER SUCT BULB TIP NO VENT (SUCTIONS) ×3 IMPLANT

## 2019-11-25 NOTE — Anesthesia Procedure Notes (Signed)
Anesthesia Regional Block: Axillary brachial plexus block   Pre-Anesthetic Checklist: ,, timeout performed, Correct Patient, Correct Site, Correct Laterality, Correct Procedure, Correct Position, site marked, Risks and benefits discussed,  Surgical consent,  Pre-op evaluation,  At surgeon's request and post-op pain management  Laterality: Right  Prep: chloraprep       Needles:  Injection technique: Single-shot  Needle Type: Echogenic Needle     Needle Length: 9cm  Needle Gauge: 21     Additional Needles:   Procedures:,,,, ultrasound used (permanent image in chart),,,,  Narrative:  Start time: 11/25/2019 4:38 PM End time: 11/25/2019 4:48 PM Injection made incrementally with aspirations every 5 mL.  Performed by: Personally  Anesthesiologist: Cecile Hearing, MD  Additional Notes: No pain on injection. No increased resistance to injection. Injection made in 5cc increments.  Good needle visualization.  Patient tolerated procedure well.

## 2019-11-25 NOTE — Transfer of Care (Signed)
Immediate Anesthesia Transfer of Care Note  Patient: Laurie Schmidt  Procedure(s) Performed: REPEAT IRRIGATION AND DEBRIDEMENT RIGHT INDEX FINGER (Right Finger)  Patient Location: PACU  Anesthesia Type:MAC and Regional  Level of Consciousness: drowsy and patient cooperative  Airway & Oxygen Therapy: Patient Spontanous Breathing and Patient connected to face mask oxygen  Post-op Assessment: Report given to RN and Post -op Vital signs reviewed and stable  Post vital signs: Reviewed and stable  Last Vitals:  Vitals Value Taken Time  BP 108/72 11/25/19 1805  Temp    Pulse 70 11/25/19 1805  Resp 14 11/25/19 1805  SpO2 100 % 11/25/19 1805  Vitals shown include unvalidated device data.  Last Pain:  Vitals:   11/25/19 1705  TempSrc:   PainSc: 0-No pain      Patients Stated Pain Goal: 2 (32/99/24 2683)  Complications: No apparent anesthesia complications

## 2019-11-25 NOTE — Anesthesia Procedure Notes (Signed)
Procedure Name: MAC Date/Time: 11/25/2019 5:20 PM Performed by: Kathryne Hitch, CRNA Pre-anesthesia Checklist: Patient identified, Suction available, Patient being monitored and Emergency Drugs available Patient Re-evaluated:Patient Re-evaluated prior to induction Oxygen Delivery Method: Nasal cannula Preoxygenation: Pre-oxygenation with 100% oxygen Induction Type: IV induction Dental Injury: Teeth and Oropharynx as per pre-operative assessment

## 2019-11-25 NOTE — Plan of Care (Signed)
  Problem: Coping: Goal: Level of anxiety will decrease Outcome: Progressing   Problem: Pain Managment: Goal: General experience of comfort will improve Outcome: Progressing   

## 2019-11-25 NOTE — Op Note (Signed)
Operative note Nov 25, 2019  Dominica Severin MD  Preoperative diagnosis right index finger infectious tenosynovitis with deep abscess  Postop diagnosis the same  Operative procedure #1 irrigation debridement skin subcutaneous tissue tendon and associated soft tissue structures with curette knife and scissor 1 inch incision in nature right index finger.  #2 flexor digitorum profundus tenolysis tenosynovectomy extensive in nature right index finger  Anesthesia block with IV sedation  Surgeon Dominica Severin  Estimated blood loss minimal  Complications none  Operative procedure patient was seen by myself and anesthesia taken to the operative theater underwent block anesthetic and IV sedation she was prepped with Hibiclens prescrub followed by 10-minute surgical Betadine scrub and paint following this patient underwent irrigation debridement of skin and subcutaneous tissue and nonviable prenecrotic fatty tissue as well as peritendinous tissue.  This was performed with curette knife and scissor.  There were no complicating features.  This area looked improved and much more viable.  Following this I performed a deeper dissection with FDP/flexor digitorum profundus tenolysis tenosynovectomy extensive in nature.  Once tenolysis tenosynovectomy was performed we then placed 3 L through and through the wound followed by a bottle of Aricept solution followed by loose skin closure with 2 vessel loop drains.  Patient tolerated this well there are no complications.  We will continue close observation and await final cultures.  We will continue vancomycin and Zosyn.  She is growing out gram-positive cocci and gram-negative rods thus far.  Fortunately the tissue is viable at this juncture.  We will continue a day by day approach.  Keaten Mashek MD

## 2019-11-25 NOTE — Anesthesia Preprocedure Evaluation (Signed)
Anesthesia Evaluation  Patient identified by MRN, date of birth, ID band Patient awake    Reviewed: Allergy & Precautions, NPO status , Patient's Chart, lab work & pertinent test results  Airway Mallampati: II  TM Distance: >3 FB Neck ROM: Full    Dental  (+) Teeth Intact, Dental Advisory Given   Pulmonary COPD, former smoker,    Pulmonary exam normal breath sounds clear to auscultation       Cardiovascular Exercise Tolerance: Good negative cardio ROS Normal cardiovascular exam Rhythm:Regular Rate:Normal     Neuro/Psych negative neurological ROS  negative psych ROS   GI/Hepatic negative GI ROS, Neg liver ROS,   Endo/Other  negative endocrine ROS  Renal/GU negative Renal ROS  negative genitourinary   Musculoskeletal Infection right index finger   Abdominal   Peds  Hematology negative hematology ROS (+)   Anesthesia Other Findings   Reproductive/Obstetrics                             Anesthesia Physical  Anesthesia Plan  ASA: II  Anesthesia Plan: Regional and MAC   Post-op Pain Management:    Induction: Intravenous  PONV Risk Score and Plan: 2 and Propofol infusion and Treatment may vary due to age or medical condition  Airway Management Planned: Nasal Cannula and Natural Airway  Additional Equipment:   Intra-op Plan:   Post-operative Plan:   Informed Consent: I have reviewed the patients History and Physical, chart, labs and discussed the procedure including the risks, benefits and alternatives for the proposed anesthesia with the patient or authorized representative who has indicated his/her understanding and acceptance.     Dental advisory given  Plan Discussed with: CRNA  Anesthesia Plan Comments:         Anesthesia Quick Evaluation

## 2019-11-25 NOTE — Progress Notes (Signed)
Pt being transferred to pre-op, pt not in distress and tolerated well.

## 2019-11-26 NOTE — Progress Notes (Signed)
Pharmacy Antibiotic Note  Laurie Schmidt is a 59 y.o. female admitted on 11/23/2019 with a R-index finger infection now s/p I&D. She continues on vancomycin and zosyn. Pt is afebrile and WBC is WNL. Last SCr was WNL. Per MD note, planning to change to PO antibiotics tomorrow. Pt is growing pan-sensitive strep intermedius in abscess cultures.  Plan: Continue vanc 750mg  IV Q12H Continue zosyn 3.375gm IV Q8H (4 hr inf) F/u renal fxn, C&S, clinical status and trough at [facility] Holding off on vanc trough in anticipation of changing to PO tomorrow  Height: 5\' 10"  (177.8 cm) Weight: 73.5 kg (162 lb) IBW/kg (Calculated) : 68.5  Temp (24hrs), Avg:97.9 F (36.6 C), Min:97.5 F (36.4 C), Max:98.5 F (36.9 C)  Recent Labs  Lab 11/23/19 2307  WBC 8.7  CREATININE 0.82    Estimated Creatinine Clearance: 78.9 mL/min (by C-G formula based on SCr of 0.82 mg/dL).    No Known Allergies  Antimicrobials this admission: Vanc 5/17 >> Zosy 5/17 >>  Microbiology results: 5/17 R-index finger tissue cx >>strep intermedius 5/17 COVID >> neg  Thank you for allowing pharmacy to be a part of this patient's care.  6/17, PharmD, BCPS Clinical Pharmacist Please see AMION for all pharmacy numbers 11/26/2019 10:44 AM

## 2019-11-26 NOTE — Progress Notes (Signed)
Pt very concerned about the tingling sensation in her fingertips. Pt denies the sensation getting worse, and has full sensation to all fingers. Per Dr. Amanda Pea, sensation is normal up to 36 hours post surgical. Pt notified.

## 2019-11-26 NOTE — Plan of Care (Signed)

## 2019-11-26 NOTE — Anesthesia Postprocedure Evaluation (Signed)
Anesthesia Post Note  Patient: Laurie Schmidt  Procedure(s) Performed: REPEAT IRRIGATION AND DEBRIDEMENT RIGHT INDEX FINGER (Right Finger)     Patient location during evaluation: PACU Anesthesia Type: Regional Level of consciousness: awake and alert, awake and oriented Pain management: pain level controlled Vital Signs Assessment: post-procedure vital signs reviewed and stable Respiratory status: spontaneous breathing, nonlabored ventilation, respiratory function stable and patient connected to nasal cannula oxygen Cardiovascular status: stable and blood pressure returned to baseline Postop Assessment: no apparent nausea or vomiting Anesthetic complications: no    Last Vitals:  Vitals:   11/26/19 0311 11/26/19 0756  BP: 120/76 131/81  Pulse: 70 69  Resp: 14 15  Temp: 36.7 C 36.9 C  SpO2: 95% 100%    Last Pain:  Vitals:   11/26/19 0850  TempSrc:   PainSc: 7                  Cecile Hearing

## 2019-11-26 NOTE — Plan of Care (Signed)
  Problem: Pain Managment: Goal: General experience of comfort will improve Outcome: Progressing   

## 2019-11-26 NOTE — Progress Notes (Signed)
Patient ID: Laurie Schmidt, female   DOB: December 27, 1960, 59 y.o.   MRN:    Patient seen and examined at bedside.  She has good refill to the tip of her finger.  She flexes the finger nicely and does not have a inordinate amount of pain  I discussed all issues with her at length and the relevant findings and concerns.  At present juncture I do feel the patient looks very reasonably well in terms of her response to care.  Cultures have grown out a Streptococcus species.  We will continue the Vanco and Zosyn for now and plan to advance her to p.o. antibiotics after my dressing change tomorrow.  Tomorrow we will plan for dressing change with removal of the drains and look at the wound conditions in general.  Hopefully we can transition to home and outpatient antibiotics with close follow-up in the office at that time.  If she does not continue to improve a third washout would be a possibility and she understands this.  The patient is alert and oriented in no acute distress. The patient complains of pain in the affected upper extremity.  The patient is noted to have a normal HEENT exam. Lung fields show equal chest expansion and no shortness of breath. Abdomen exam is nontender without distention. Lower extremity examination does not show any fracture dislocation or blood clot symptoms. Pelvis is stable and the neck and back are stable and nontender.   We discussed all issues plans and concerns.  Once again, dressing change tomorrow and neck steps based upon her improvement or lack thereof.  I reviewed her chart and the microbiology update.  Dorethy Tomey MD

## 2019-11-27 LAB — BASIC METABOLIC PANEL
Anion gap: 7 (ref 5–15)
BUN: 8 mg/dL (ref 6–20)
CO2: 23 mmol/L (ref 22–32)
Calcium: 8.5 mg/dL — ABNORMAL LOW (ref 8.9–10.3)
Chloride: 110 mmol/L (ref 98–111)
Creatinine, Ser: 0.74 mg/dL (ref 0.44–1.00)
GFR calc Af Amer: >60 mL/min (ref >60)
GFR calc non Af Amer: >60 mL/min (ref >60)
Glucose, Bld: 102 mg/dL — ABNORMAL HIGH (ref 70–99)
Potassium: 4 mmol/L (ref 3.5–5.1)
Sodium: 140 mmol/L (ref 135–145)

## 2019-11-27 MED ORDER — AMOXICILLIN-POT CLAVULANATE 875-125 MG PO TABS
1.0000 | ORAL_TABLET | Freq: Two times a day (BID) | ORAL | Status: DC
  Administered 2019-11-27 – 2019-11-28 (×2): 1 via ORAL
  Filled 2019-11-27 (×2): qty 1

## 2019-11-27 NOTE — Progress Notes (Signed)
Patient ID: Laurie Schmidt, female   DOB: 10-10-1960, 59 y.o.   MRN: [id number] Patient seen and examined at bedside.  I removed her drains.  There is no discharge.  Her flexor sheath is nontender.  There is no purulent flexor tendon signs at present time.  She is swelling as expected she does have good early motion and skin coverage is satisfactory.  Cultures are final.  I Minna switch her to Augmentin 875 mg twice daily.  We will plan for transition to home and care for follow-up in the office tomorrow morning if she looks well.  I have discussed with her all issues.  The patient is alert and oriented in no acute distress. The patient complains of pain in the affected upper extremity.  The patient is noted to have a normal HEENT exam. Lung fields show equal chest expansion and no shortness of breath. Abdomen exam is nontender without distention. Lower extremity examination does not show any fracture dislocation or blood clot symptoms. Pelvis is stable and the neck and back are stable and nontender.   Navika Hoopes MD

## 2019-11-27 NOTE — Plan of Care (Signed)

## 2019-11-28 MED ORDER — AMOXICILLIN-POT CLAVULANATE 875-125 MG PO TABS: 1.0000 | ORAL_TABLET | Freq: Two times a day (BID) | ORAL | 0 refills | Status: AC

## 2019-11-28 MED ORDER — OXYCODONE HCL 5 MG PO TABS: 5.0000 mg | ORAL_TABLET | ORAL | 0 refills | Status: DC | PRN

## 2019-11-28 NOTE — Discharge Summary (Signed)
Physician Discharge Summary  Patient ID: Laurie Schmidt MRN: [id number] DOB/AGE: 10-05-1960 59 y.o.  Admit date: 11/23/2019 Discharge date:   Admission Diagnoses: Infection right index finger Past Medical History:  Diagnosis Date  . Allergy   . COPD (chronic obstructive pulmonary disease) ([place])     Discharge Diagnoses:  Active Problems:   Abscess of finger of right hand   Surgeries: Procedure(s): REPEAT IRRIGATION AND DEBRIDEMENT RIGHT INDEX FINGER on 11/25/2019    Consultants:   Discharged Condition: Improved  Hospital Course: Laurie Schmidt is an 59 y.o. female who was admitted 11/23/2019 with a chief complaint of No chief complaint on file. , and found to have a diagnosis of Infection right index finger.  They were brought to the operating room on 11/25/2019 and underwent Procedure(s): REPEAT IRRIGATION AND DEBRIDEMENT RIGHT INDEX FINGER.    They were given perioperative antibiotics:  Anti-infectives (From admission, onward)   Start     Dose/Rate Route Frequency Ordered Stop   11/28/19 0000  amoxicillin-clavulanate (AUGMENTIN) 875-125 MG tablet     1 tablet Oral 2 times daily 11/28/19 0918 12/12/19 2359   11/27/19 2200  amoxicillin-clavulanate (AUGMENTIN) 875-125 MG per tablet 1 tablet     1 tablet Oral Every 12 hours 11/27/19 1831     11/24/19 0800  piperacillin-tazobactam (ZOSYN) IVPB 3.375 g  Status:  Discontinued     3.375 g 12.5 mL/hr over 240 Minutes Intravenous Every 8 hours 11/24/19 0140 11/27/19 1831   11/24/19 0600  vancomycin (VANCOREADY) IVPB 750 mg/150 mL  Status:  Discontinued     750 mg 150 mL/hr over 60 Minutes Intravenous Every 12 hours 11/24/19 0140 11/27/19 1831   11/23/19 2245  piperacillin-tazobactam (ZOSYN) IVPB 3.375 g     3.375 g 100 mL/hr over 30 Minutes Intravenous  Once 11/23/19 2237 11/24/19 0739    .  They were given sequential compression devices, early ambulation, and Other (comment) for DVT prophylaxis.  Recent vital signs:   Patient Vitals for the past 24 hrs:  BP Temp Temp src Pulse Resp SpO2  11/28/19 0751 113/82 98 F (36.7 C) -- 71 18 100 %  11/28/19 0410 114/74 97.6 F (36.4 C) Oral 60 16 99 %  11/27/19 2040 (!) 141/82 97.7 F (36.5 C) Oral 61 16 100 %  11/27/19 1233 130/89 98.2 F (36.8 C) Oral 65 14 96 %  .  Recent laboratory studies: No results found.  Discharge Medications:   Allergies as of 11/28/2019   No Known Allergies     Medication List    STOP taking these medications   sulfamethoxazole-trimethoprim 800-160 MG tablet Commonly known as: BACTRIM DS     TAKE these medications   amoxicillin-clavulanate 875-125 MG tablet Commonly known as: Augmentin Take 1 tablet by mouth 2 (two) times daily for 14 days.   b complex vitamins tablet Take 1 tablet by mouth daily.   CALCIUM 500+D PO Take by mouth.   cephALEXin 500 MG capsule Commonly known as: KEFLEX Take 500 mg by mouth in the morning, at noon, in the evening, and at bedtime.   cholecalciferol 1000 units tablet Commonly known as: VITAMIN D Take 1,000 Units by mouth daily.   CoQ10 100 MG Caps Take 1 tablet by mouth daily.   HYDROcodone-acetaminophen 5-325 MG tablet Commonly known as: NORCO/VICODIN Take 1 tablet by mouth every 6 (six) hours as needed for moderate pain.   ibuprofen 200 MG tablet Commonly known as: ADVIL Take 200 mg by mouth every 6 (six)  hours as needed for moderate pain.   loratadine 10 MG tablet Commonly known as: CLARITIN Take 10 mg by mouth daily.   oxyCODONE 5 MG immediate release tablet Commonly known as: Roxicodone Take 1 tablet (5 mg total) by mouth every 4 (four) hours as needed.   Red Yeast Rice 600 MG Caps Take 1 capsule by mouth daily.   vitamin C 100 MG tablet Take 100 mg by mouth daily.       Diagnostic Studies: No results found.  They benefited maximally from their hospital stay and there were no complications.     Disposition: Discharge disposition: 01-Home or Self  Care      Discharge Instructions    Call MD / Call 911   Complete by: As directed    If you experience chest pain or shortness of breath, CALL 911 and be transported to the hospital emergency room.  If you develope a fever above 101 F, pus (white drainage) or increased drainage or redness at the wound, or calf pain, call your surgeon's office.   Constipation Prevention   Complete by: As directed    Drink plenty of fluids.  Prune juice may be helpful.  You may use a stool softener, such as Colace (over the counter) 100 mg twice a day.  Use MiraLax (over the counter) for constipation as needed.   Diet - low sodium heart healthy   Complete by: As directed    Increase activity slowly as tolerated   Complete by: As directed      Follow-up Information    Dominica Severin, MD Follow up in 3 day(s).   Specialty: Orthopedic Surgery Why: come to the office monday at 10 am Contact information: [address] [place] [address] [phone]          Status post irrigation debridement Monday and Wednesday earlier in the week of a deep abscess with infectious tenosynovitis of the flexor apparatus right index finger.  Patient ultimately grew out Streptococcus sensitive to penicillins.  Given her Gram stain smear with gram-negative rods I am in a place her on Augmentin for 14 days.  Her dressing change yesterday looked excellent.  She is tolerating her antibiotics.  We will DC her on oxycodone as needed pain and Augmentin 875 twice daily x14 days.  I will see her in my office Monday 10 AM.  At the time of discharge she is awake alert and oriented vital signs are stable she has no signs of DVT or complications.  Final diagnosis status post irrigation debridement deep abscess with infectious tenosynovitis about the flexor apparatus growing out Streptococcus as cultures reveal.  All questions have been addressed encouraged and answered.  We will keep a very close eye on her  and her infections to hopefully see evolution to a quiescent state of affairs  Signed: Oletta Cohn III 11/28/2019, 9:20 AM

## 2019-11-28 NOTE — Plan of Care (Signed)
  Problem: Education: Goal: Knowledge of General Education information will improve Description: Including pain rating scale, medication(s)/side effects and non-pharmacologic comfort measures 11/28/2019 1110 by Lenise Arena, RN Outcome: Adequate for Discharge 11/28/2019 2841 by Lenise Arena, RN Outcome: Progressing   Problem: Health Behavior/Discharge Planning: Goal: Ability to manage health-related needs will improve 11/28/2019 1110 by Lenise Arena, RN Outcome: Adequate for Discharge 11/28/2019 3244 by Lenise Arena, RN Outcome: Progressing   Problem: Clinical Measurements: Goal: Ability to maintain clinical measurements within normal limits will improve 11/28/2019 1110 by Lenise Arena, RN Outcome: Adequate for Discharge 11/28/2019 0102 by Lenise Arena, RN Outcome: Progressing Goal: Will remain free from infection 11/28/2019 1110 by Lenise Arena, RN Outcome: Adequate for Discharge 11/28/2019 7253 by Lenise Arena, RN Outcome: Progressing Goal: Diagnostic test results will improve 11/28/2019 1110 by Lenise Arena, RN Outcome: Adequate for Discharge 11/28/2019 6644 by Lenise Arena, RN Outcome: Progressing Goal: Respiratory complications will improve 11/28/2019 1110 by Lenise Arena, RN Outcome: Adequate for Discharge 11/28/2019 0347 by Lenise Arena, RN Outcome: Progressing Goal: Cardiovascular complication will be avoided 11/28/2019 1110 by Lenise Arena, RN Outcome: Adequate for Discharge 11/28/2019 4259 by Lenise Arena, RN Outcome: Progressing   Problem: Activity: Goal: Risk for activity intolerance will decrease 11/28/2019 1110 by Lenise Arena, RN Outcome: Adequate for Discharge 11/28/2019 5638 by Lenise Arena, RN Outcome: Progressing   Problem: Nutrition: Goal: Adequate nutrition will be maintained 11/28/2019 1110 by Lenise Arena, RN Outcome: Adequate for Discharge 11/28/2019 7564 by Lenise Arena, RN Outcome: Progressing   Problem: Coping: Goal: Level of anxiety  will decrease 11/28/2019 1110 by Lenise Arena, RN Outcome: Adequate for Discharge 11/28/2019 3329 by Lenise Arena, RN Outcome: Progressing   Problem: Elimination: Goal: Will not experience complications related to bowel motility 11/28/2019 1110 by Lenise Arena, RN Outcome: Adequate for Discharge 11/28/2019 5188 by Lenise Arena, RN Outcome: Progressing Goal: Will not experience complications related to urinary retention 11/28/2019 1110 by Lenise Arena, RN Outcome: Adequate for Discharge 11/28/2019 4166 by Lenise Arena, RN Outcome: Progressing   Problem: Pain Managment: Goal: General experience of comfort will improve 11/28/2019 1110 by Lenise Arena, RN Outcome: Adequate for Discharge 11/28/2019 0630 by Lenise Arena, RN Outcome: Progressing   Problem: Safety: Goal: Ability to remain free from injury will improve 11/28/2019 1110 by Lenise Arena, RN Outcome: Adequate for Discharge 11/28/2019 1601 by Lenise Arena, RN Outcome: Progressing   Problem: Skin Integrity: Goal: Risk for impaired skin integrity will decrease 11/28/2019 1110 by Lenise Arena, RN Outcome: Adequate for Discharge 11/28/2019 0932 by Lenise Arena, RN Outcome: Progressing

## 2019-11-28 NOTE — Plan of Care (Signed)

## 2019-11-28 NOTE — Progress Notes (Signed)
Patient discharged  Home by private car, all discharge instructions went over and acknowledged by patient including where to pick up prescriptions.  All personal belongings were gathered and taken home by sister.  Both denied further questions at time of discharge.

## 2019-11-28 NOTE — Discharge Instructions (Signed)

## 2019-11-30 LAB — AEROBIC/ANAEROBIC CULTURE W GRAM STAIN (SURGICAL/DEEP WOUND)

## 2020-01-20 ENCOUNTER — Ambulatory Visit (INDEPENDENT_AMBULATORY_CARE_PROVIDER_SITE_OTHER): Payer: Commercial Managed Care - PPO | Admitting: Internal Medicine

## 2020-01-20 ENCOUNTER — Other Ambulatory Visit: Payer: Self-pay

## 2020-01-20 ENCOUNTER — Encounter: Payer: Self-pay | Admitting: Internal Medicine

## 2020-01-20 DIAGNOSIS — L02511 Cutaneous abscess of right hand: Secondary | ICD-10-CM

## 2020-01-20 NOTE — Progress Notes (Signed)
[facility]  Reason for Consult: Right index finger infection Referring Provider: Dr. Onalee Hua  Assessment: Laurie Schmidt Lean has a smoldering, polymicrobial infection of her right index finger.  Recent MRI shows diffuse soft tissue and bone inflammation without any discrete abscess.  I have discussed management options at length with her and with Dr. Amanda Pea.  We have decided on stopping amoxicillin clavulanate now, attaining deep tissue biopsies for stain and culture then starting IV antibiotic therapy, most likely with ertapenem.  She is in agreement with that plan  Plan: 1. Discontinue amoxicillin clavulanate now 2. Tissue biopsy on 02/05/2020 3. Follow-up with me on 02/08/2020  Patient Active Problem List   Diagnosis Date Noted   Abscess of finger of right hand 11/23/2019    Patient's Medications  New Prescriptions   No medications on file  Previous Medications   AMOXICILLIN-CLAVULANATE (AUGMENTIN) 875-125 MG TABLET    Take 1 tablet by mouth 2 (two) times daily.   ASCORBIC ACID (VITAMIN C) 100 MG TABLET    Take 100 mg by mouth daily.   B COMPLEX VITAMINS TABLET    Take 1 tablet by mouth daily.   CALCIUM CARB-CHOLECALCIFEROL (CALCIUM 500+D PO)    Take by mouth.   CEPHALEXIN (KEFLEX) 500 MG CAPSULE    Take 500 mg by mouth in the morning, at noon, in the evening, and at bedtime.   CHOLECALCIFEROL (VITAMIN D) 1000 UNITS TABLET    Take 1,000 Units by mouth daily.   COENZYME Q10 (COQ10) 100 MG CAPS    Take 1 tablet by mouth daily.   HYDROCODONE-ACETAMINOPHEN (NORCO/VICODIN) 5-325 MG TABLET    Take 1 tablet by mouth every 6 (six) hours as needed for moderate pain.   IBUPROFEN (ADVIL,MOTRIN) 200 MG TABLET    Take 200 mg by mouth every 6 (six) hours as needed for moderate pain.    LORATADINE (CLARITIN) 10 MG TABLET    Take 10 mg by mouth daily.   OXYCODONE (ROXICODONE) 5 MG IMMEDIATE RELEASE TABLET    Take 1 tablet (5 mg total) by mouth every 4 (four)  hours as needed.   RED YEAST RICE 600 MG CAPS    Take 1 capsule by mouth daily.   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  She had her garden gloves on during these activities.  She was seen at a local urgent care and underwent incision and drainage and was started on trimethoprim sulfamethoxazole on 11/18/2019.  She did not improve and was seen at another urgent care before being referred to Dr. Amanda Pea.  He performed incision and drainage on 11/23/2019 as described tenosynovitis.  Cultures grew strep intermedius, Eikenella and Prevotella (2 species).  She was started on amoxicillin clavulanate upon discharge and has been on it ever since.  She has improved overall but continues to have diffuse swelling of her right index finger with discoloration.  She has pain in the distal two thirds of her finger.  She underwent an MRI on 01/09/2020 which showed:  Marrow edema changes within the distal phalanx and less so distal aspect of the proximal phalanx of the second digit with surrounding soft  tissue inflammation and mild irregularity and deformity of the joint.  The subcutis tissues are diffusely inflamed.  Differential lies between inflammatory arthropathy and treated or partially treated infection.  Former slightly favored.  Continued follow-up and clinical correlation and laboratory correlation would be of value.  Review of Systems: Review of Systems  Constitutional: Positive for diaphoresis. Negative for chills and fever.  Respiratory: Negative for cough.   Cardiovascular: Negative for chest pain.  Gastrointestinal: Negative for abdominal pain, diarrhea, nausea and vomiting.  Musculoskeletal: Positive  for joint pain.  Skin: Negative for rash.      Past Medical History:  Diagnosis Date   Allergy    COPD (chronic obstructive pulmonary disease) (HCC)     Social History   Tobacco Use   Smoking status: Former Smoker    Packs/day: 1.00    Years: 31.00    Pack years: 31.00    Types: Cigarettes    Quit date: 07/12/2014    Years since quitting: 5.5   Smokeless tobacco: Never Used  Vaping Use   Vaping Use: Never used  Substance Use Topics   Alcohol use: Yes    Alcohol/week: 0.0 standard drinks    Comment: glass of wine or 2 with dinner   Drug use: Never    Family History  Problem Relation Age of Onset   Cancer Father        melanoma   Breast cancer Paternal Aunt    Heart attack Maternal Grandmother    Breast cancer Maternal Grandmother    Heart attack Paternal Grandmother    Heart attack Paternal Grandfather    Healthy Mother    No Known Allergies  OBJECTIVE: Vitals:   01/20/20 1455  BP: 133/80  Pulse: 71  SpO2: 99%  Weight: 161 lb (73 kg)   Body mass index is 23.1 kg/m.   Physical Exam Constitutional:      Comments: She is very pleasant and in no distress.  She is accompanied by a close friend.  Musculoskeletal:        General: Swelling and tenderness present.     Comments: She has diffuse swelling of the right index finger with dusky, chronic appearing discoloration.  She has limited range of motion.  She has some firm, painful swelling on the palmar side of her finger.  She has a deformity of the proximal nail.  Psychiatric:        Mood and Affect: Mood normal.    Sed Rate (mm/h)  Date Value  01/20/2020 2   CRP (mg/L)  Date Value  01/20/2020 0.5    Microbiology: No results found for this or any previous visit (from the past 240 hour(s)).  Cliffton Asters, MD [facility] [phone] pager   [phone] cell 01/20/2020, 3:46 PM

## 2020-01-21 LAB — SEDIMENTATION RATE: Sed Rate: 2 mm/h (ref 0–30)

## 2020-01-21 LAB — BASIC METABOLIC PANEL
BUN: 14 mg/dL (ref 7–25)
CO2: 24 mmol/L (ref 20–32)
Calcium: 9 mg/dL (ref 8.6–10.4)
Chloride: 106 mmol/L (ref 98–110)
Creat: 0.91 mg/dL (ref 0.50–0.99)
Glucose, Bld: 88 mg/dL (ref 65–99)
Potassium: 4.6 mmol/L (ref 3.5–5.3)
Sodium: 140 mmol/L (ref 135–146)

## 2020-01-21 LAB — CBC
HCT: 40.6 % (ref 35.0–45.0)
Hemoglobin: 14 g/dL (ref 11.7–15.5)
MCH: 34.9 pg — ABNORMAL HIGH (ref 27.0–33.0)
MCHC: 34.5 g/dL (ref 32.0–36.0)
MCV: 101.2 fL — ABNORMAL HIGH (ref 80.0–100.0)
MPV: 10.7 fL (ref 7.5–12.5)
Platelets: 203 10*3/uL (ref 140–400)
RBC: 4.01 10*6/uL (ref 3.80–5.10)
RDW: 11.7 % (ref 11.0–15.0)
WBC: 6.6 10*3/uL (ref 3.8–10.8)

## 2020-01-21 LAB — C-REACTIVE PROTEIN: CRP: 0.5 mg/L (ref <8.0)

## 2020-02-01 ENCOUNTER — Encounter (HOSPITAL_BASED_OUTPATIENT_CLINIC_OR_DEPARTMENT_OTHER): Payer: Self-pay | Admitting: Orthopedic Surgery

## 2020-02-01 ENCOUNTER — Other Ambulatory Visit: Payer: Self-pay

## 2020-02-02 ENCOUNTER — Other Ambulatory Visit (HOSPITAL_COMMUNITY): Payer: Commercial Managed Care - PPO

## 2020-02-03 ENCOUNTER — Other Ambulatory Visit (HOSPITAL_COMMUNITY)
Admission: RE | Admit: 2020-02-03 | Discharge: 2020-02-03 | Disposition: A | Discharge status: Home / Self Care | Payer: Commercial Managed Care - PPO | Source: Ambulatory Visit | Attending: Orthopedic Surgery | Admitting: Orthopedic Surgery

## 2020-02-03 DIAGNOSIS — Z01812 Encounter for preprocedural laboratory examination: Secondary | ICD-10-CM | POA: Diagnosis not present

## 2020-02-03 DIAGNOSIS — Z20822 Contact with and (suspected) exposure to covid-19: Secondary | ICD-10-CM | POA: Diagnosis not present

## 2020-02-03 LAB — SARS CORONAVIRUS 2 (TAT 6-24 HRS): SARS Coronavirus 2: NEGATIVE

## 2020-02-04 NOTE — Progress Notes (Signed)

## 2020-02-05 ENCOUNTER — Other Ambulatory Visit: Payer: Self-pay

## 2020-02-05 ENCOUNTER — Encounter (HOSPITAL_BASED_OUTPATIENT_CLINIC_OR_DEPARTMENT_OTHER): Payer: Self-pay | Admitting: Orthopedic Surgery

## 2020-02-05 ENCOUNTER — Encounter (HOSPITAL_BASED_OUTPATIENT_CLINIC_OR_DEPARTMENT_OTHER)
Admission: RE | Disposition: A | Discharge status: Home / Self Care | Payer: Self-pay | Source: Home / Self Care | Attending: Orthopedic Surgery

## 2020-02-05 ENCOUNTER — Ambulatory Visit (HOSPITAL_BASED_OUTPATIENT_CLINIC_OR_DEPARTMENT_OTHER): Payer: Commercial Managed Care - PPO | Admitting: Anesthesiology

## 2020-02-05 ENCOUNTER — Ambulatory Visit (HOSPITAL_BASED_OUTPATIENT_CLINIC_OR_DEPARTMENT_OTHER)
Admission: RE | Admit: 2020-02-05 | Discharge: 2020-02-05 | Disposition: A | Discharge status: Home / Self Care | Payer: Commercial Managed Care - PPO | Attending: Orthopedic Surgery | Admitting: Orthopedic Surgery

## 2020-02-05 DIAGNOSIS — S6991XA Unspecified injury of right wrist, hand and finger(s), initial encounter: Secondary | ICD-10-CM | POA: Insufficient documentation

## 2020-02-05 DIAGNOSIS — J449 Chronic obstructive pulmonary disease, unspecified: Secondary | ICD-10-CM | POA: Insufficient documentation

## 2020-02-05 DIAGNOSIS — X58XXXA Exposure to other specified factors, initial encounter: Secondary | ICD-10-CM | POA: Insufficient documentation

## 2020-02-05 DIAGNOSIS — Z87891 Personal history of nicotine dependence: Secondary | ICD-10-CM | POA: Insufficient documentation

## 2020-02-05 DIAGNOSIS — R2231 Localized swelling, mass and lump, right upper limb: Secondary | ICD-10-CM | POA: Insufficient documentation

## 2020-02-05 HISTORY — PX: I & D EXTREMITY: SHX5045

## 2020-02-05 SURGERY — IRRIGATION AND DEBRIDEMENT EXTREMITY
Anesthesia: Monitor Anesthesia Care | Site: Finger | Laterality: Right

## 2020-02-05 MED ORDER — CEFAZOLIN SODIUM-DEXTROSE 2-3 GM-%(50ML) IV SOLR
INTRAVENOUS | Status: DC | PRN
Start: 2020-02-05 — End: 2020-02-05
  Administered 2020-02-05: 2 g via INTRAVENOUS

## 2020-02-05 MED ORDER — FENTANYL CITRATE (PF) 100 MCG/2ML IJ SOLN
INTRAMUSCULAR | Status: DC | PRN
  Administered 2020-02-05 (×2): 50 ug via INTRAVENOUS

## 2020-02-05 MED ORDER — AMOXICILLIN-POT CLAVULANATE 875-125 MG PO TABS: 1.0000 | ORAL_TABLET | Freq: Two times a day (BID) | ORAL | 0 refills | Status: AC

## 2020-02-05 MED ORDER — FENTANYL CITRATE (PF) 100 MCG/2ML IJ SOLN
INTRAMUSCULAR | Status: AC
  Filled 2020-02-05: qty 2

## 2020-02-05 MED ORDER — FENTANYL CITRATE (PF) 100 MCG/2ML IJ SOLN
50.0000 ug | Freq: Once | INTRAMUSCULAR | Status: AC
  Administered 2020-02-05: 50 ug via INTRAVENOUS

## 2020-02-05 MED ORDER — MIDAZOLAM HCL 5 MG/5ML IJ SOLN
INTRAMUSCULAR | Status: DC | PRN
  Administered 2020-02-05 (×2): 1 mg via INTRAVENOUS

## 2020-02-05 MED ORDER — OXYCODONE HCL 5 MG PO TABS: 5.0000 mg | ORAL_TABLET | ORAL | 0 refills | Status: DC | PRN

## 2020-02-05 MED ORDER — ACETAMINOPHEN 500 MG PO TABS
ORAL_TABLET | ORAL | Status: AC
  Filled 2020-02-05: qty 2

## 2020-02-05 MED ORDER — PROPOFOL 500 MG/50ML IV EMUL
INTRAVENOUS | Status: DC | PRN
  Administered 2020-02-05: 75 ug/kg/min via INTRAVENOUS

## 2020-02-05 MED ORDER — LACTATED RINGERS IV SOLN: INTRAVENOUS | Status: DC

## 2020-02-05 MED ORDER — ACETAMINOPHEN 500 MG PO TABS
1000.0000 mg | ORAL_TABLET | Freq: Once | ORAL | Status: AC
  Administered 2020-02-05: 1000 mg via ORAL

## 2020-02-05 MED ORDER — ROPIVACAINE HCL 5 MG/ML IJ SOLN
INTRAMUSCULAR | Status: DC | PRN
Start: 2020-02-05 — End: 2020-02-05
  Administered 2020-02-05: 30 mL via PERINEURAL

## 2020-02-05 MED ORDER — LIDOCAINE 2% (20 MG/ML) 5 ML SYRINGE
INTRAMUSCULAR | Status: DC | PRN
  Administered 2020-02-05: 60 mg via INTRAVENOUS

## 2020-02-05 MED ORDER — MIDAZOLAM HCL 2 MG/2ML IJ SOLN
2.0000 mg | Freq: Once | INTRAMUSCULAR | Status: AC
  Administered 2020-02-05: 2 mg via INTRAVENOUS

## 2020-02-05 MED ORDER — MIDAZOLAM HCL 2 MG/2ML IJ SOLN
INTRAMUSCULAR | Status: AC
  Filled 2020-02-05: qty 2

## 2020-02-05 MED ORDER — 0.9 % SODIUM CHLORIDE (POUR BTL) OPTIME
TOPICAL | Status: DC | PRN
  Administered 2020-02-05: 500 mL

## 2020-02-05 MED ORDER — FENTANYL CITRATE (PF) 100 MCG/2ML IJ SOLN: 25.0000 ug | INTRAMUSCULAR | Status: DC | PRN

## 2020-02-05 SURGICAL SUPPLY — 61 items
BLADE SURG 15 STRL LF DISP TIS (BLADE) ×2 IMPLANT
BLADE SURG 15 STRL SS (BLADE) ×4
BNDG COBAN SELF-ADHERE TAN 2X5 (GAUZE/BANDAGES/DRESSINGS) IMPLANT
BNDG COHESIVE 1X5 TAN NS LF (GAUZE/BANDAGES/DRESSINGS) ×1 IMPLANT
BNDG COHESIVE 1X5 TAN STRL LF (GAUZE/BANDAGES/DRESSINGS) IMPLANT
BNDG COHESIVE 2X5 TAN NS LF (GAUZE/BANDAGES/DRESSINGS) ×2 IMPLANT
BNDG COHESIVE 2X5 TAN STRL LF (GAUZE/BANDAGES/DRESSINGS) IMPLANT
BNDG COHESIVE 3X5 TAN STRL LF (GAUZE/BANDAGES/DRESSINGS) IMPLANT
BNDG CONFORM 2 STRL LF (GAUZE/BANDAGES/DRESSINGS) ×1 IMPLANT
BNDG CONFORM 3 STRL LF (GAUZE/BANDAGES/DRESSINGS) ×2 IMPLANT
BNDG ELASTIC 3X5.8 VLCR STR LF (GAUZE/BANDAGES/DRESSINGS) IMPLANT
BNDG GAUZE ELAST 4 BULKY (GAUZE/BANDAGES/DRESSINGS) IMPLANT
BRUSH SCRUB EZ PLAIN DRY (MISCELLANEOUS) ×2 IMPLANT
CORD BIPOLAR FORCEPS 12FT (ELECTRODE) ×2 IMPLANT
COVER BACK TABLE 60X90IN (DRAPES) ×2 IMPLANT
COVER WAND RF STERILE (DRAPES) IMPLANT
CUFF TOURN SGL QUICK 18X4 (TOURNIQUET CUFF) ×1 IMPLANT
DECANTER SPIKE VIAL GLASS SM (MISCELLANEOUS) ×2 IMPLANT
DRAPE EXTREMITY T 121X128X90 (DISPOSABLE) ×2 IMPLANT
DRAPE SURG 17X23 STRL (DRAPES) ×2 IMPLANT
DRSG EMULSION OIL 3X3 NADH (GAUZE/BANDAGES/DRESSINGS) ×2 IMPLANT
GAUZE SPONGE 4X4 12PLY STRL (GAUZE/BANDAGES/DRESSINGS) ×1 IMPLANT
GAUZE XEROFORM 1X8 LF (GAUZE/BANDAGES/DRESSINGS) ×1 IMPLANT
GLOVE BIOGEL M STRL SZ7.5 (GLOVE) IMPLANT
GLOVE SS BIOGEL STRL SZ 8 (GLOVE) ×1 IMPLANT
GLOVE SUPERSENSE BIOGEL SZ 8 (GLOVE) ×1
GOWN STRL REUS W/ TWL LRG LVL3 (GOWN DISPOSABLE) ×1 IMPLANT
GOWN STRL REUS W/ TWL XL LVL3 (GOWN DISPOSABLE) ×1 IMPLANT
GOWN STRL REUS W/TWL LRG LVL3 (GOWN DISPOSABLE) ×2
GOWN STRL REUS W/TWL XL LVL3 (GOWN DISPOSABLE) ×2
LOOP VESSEL MAXI BLUE (MISCELLANEOUS) IMPLANT
NDL HYPO 25X1 1.5 SAFETY (NEEDLE) ×1 IMPLANT
NEEDLE HYPO 22GX1.5 SAFETY (NEEDLE) IMPLANT
NEEDLE HYPO 25X1 1.5 SAFETY (NEEDLE) ×2 IMPLANT
NS IRRIG 1000ML POUR BTL (IV SOLUTION) ×2 IMPLANT
PACK BASIN DAY SURGERY FS (CUSTOM PROCEDURE TRAY) ×2 IMPLANT
PAD ALCOHOL SWAB (MISCELLANEOUS) IMPLANT
PAD CAST 3X4 CTTN HI CHSV (CAST SUPPLIES) IMPLANT
PADDING CAST ABS 3INX4YD NS (CAST SUPPLIES)
PADDING CAST ABS 4INX4YD NS (CAST SUPPLIES)
PADDING CAST ABS COTTON 3X4 (CAST SUPPLIES) IMPLANT
PADDING CAST ABS COTTON 4X4 ST (CAST SUPPLIES) IMPLANT
PADDING CAST COTTON 3X4 STRL (CAST SUPPLIES)
SHEET MEDIUM DRAPE 40X70 STRL (DRAPES) ×2 IMPLANT
SPLINT FIBERGLASS 3X35 (CAST SUPPLIES) IMPLANT
SPLINT FINGER 5.25 BULB (SOFTGOODS) ×1 IMPLANT
SPLINT PLASTER CAST XFAST 3X15 (CAST SUPPLIES) IMPLANT
SPLINT PLASTER CAST XFAST 4X15 (CAST SUPPLIES) IMPLANT
SPLINT PLASTER XTRA FAST SET 4 (CAST SUPPLIES)
SPLINT PLASTER XTRA FASTSET 3X (CAST SUPPLIES)
STOCKINETTE 4X48 STRL (DRAPES) ×2 IMPLANT
STOCKINETTE SYNTHETIC 3 UNSTER (CAST SUPPLIES) IMPLANT
STOCKINETTE SYNTHETIC 4 NONSTR (MISCELLANEOUS) IMPLANT
STRIP CLOSURE SKIN 1/2X4 (GAUZE/BANDAGES/DRESSINGS) IMPLANT
SUT PROLENE 4 0 PS 2 18 (SUTURE) ×2 IMPLANT
SUT PROLENE 5 0 P 3 (SUTURE) IMPLANT
SWAB COLLECTION DEVICE MRSA (MISCELLANEOUS) ×2 IMPLANT
SYR BULB EAR ULCER 3OZ GRN STR (SYRINGE) ×2 IMPLANT
SYR CONTROL 10ML LL (SYRINGE) ×2 IMPLANT
TOWEL GREEN STERILE FF (TOWEL DISPOSABLE) ×2 IMPLANT
UNDERPAD 30X36 HEAVY ABSORB (UNDERPADS AND DIAPERS) ×2 IMPLANT

## 2020-02-05 NOTE — Anesthesia Procedure Notes (Addendum)
Anesthesia Regional Block: Axillary brachial plexus block   Pre-Anesthetic Checklist: ,, timeout performed, Correct Patient, Correct Site, Correct Laterality, Correct Procedure, Correct Position, site marked, Risks and benefits discussed,  Surgical consent,  Pre-op evaluation,  At surgeon's request and post-op pain management  Laterality: Right  Prep: chloraprep       Needles:  Injection technique: Single-shot  Needle Type: Echogenic Needle     Needle Length: 9cm  Needle Gauge: 21     Additional Needles:   Procedures:, nerve stimulator,,, ultrasound used (permanent image in chart),,,,   Nerve Stimulator or Paresthesia:  Response: Median, Radial and ulnar, 0.5 mA,   Additional Responses:   Narrative:  Start time: 02/08/2020 11:45 AM End time: 02/08/2020 11:52 AM Injection made incrementally with aspirations every 5 mL.  Performed by: Personally  Anesthesiologist: Marcene Duos, MD

## 2020-02-05 NOTE — Discharge Instructions (Signed)
Please elevate your hand.  It is okay to move your fingers within the confines of the splint.  I will see you back in the office within a week for dressing change.  We will await your cultures.  No Tylenol until 5:37 pm  Post Anesthesia Home Care Instructions  Activity: Get plenty of rest for the remainder of the day. A responsible individual must stay with you for 24 hours following the procedure.  For the next 24 hours, DO NOT: -Drive a car -Advertising copywriter -Drink alcoholic beverages -Take any medication unless instructed by your physician -Make any legal decisions or sign important papers.  Meals: Start with liquid foods such as gelatin or soup. Progress to regular foods as tolerated. Avoid greasy, spicy, heavy foods. If nausea and/or vomiting occur, drink only clear liquids until the nausea and/or vomiting subsides. Call your physician if vomiting continues.  Special Instructions/Symptoms: Your throat may feel dry or sore from the anesthesia or the breathing tube placed in your throat during surgery. If this causes discomfort, gargle with warm salt water. The discomfort should disappear within 24 hours.  If you had a scopolamine patch placed behind your ear for the management of post- operative nausea and/or vomiting:  1. The medication in the patch is effective for 72 hours, after which it should be removed.  Wrap patch in a tissue and discard in the trash. Wash hands thoroughly with soap and water. 2. You may remove the patch earlier than 72 hours if you experience unpleasant side effects which may include dry mouth, dizziness or visual disturbances. 3. Avoid touching the patch. Wash your hands with soap and water after contact with the patch.    Regional Anesthesia Blocks  1. Numbness or the inability to move the "blocked" extremity may last from 3-48 hours after placement. The length of time depends on the medication injected and your individual response to the medication.  If the numbness is not going away after 48 hours, call your surgeon.  2. The extremity that is blocked will need to be protected until the numbness is gone and the  Strength has returned. Because you cannot feel it, you will need to take extra care to avoid injury. Because it may be weak, you may have difficulty moving it or using it. You may not know what position it is in without looking at it while the block is in effect.  3. For blocks in the legs and feet, returning to weight bearing and walking needs to be done carefully. You will need to wait until the numbness is entirely gone and the strength has returned. You should be able to move your leg and foot normally before you try and bear weight or walk. You will need someone to be with you when you first try to ensure you do not fall and possibly risk injury.  4. Bruising and tenderness at the needle site are common side effects and will resolve in a few days.  5. Persistent numbness or new problems with movement should be communicated to the surgeon or the [facility] [phone] [facility] [phone]).

## 2020-02-05 NOTE — Op Note (Signed)
Operative note February 05, 2020  Laurie Schmidt  Preoperative diagnosis history of right index finger infection rule out osteomyelitic focus at the distal phalanx and middle phalanx.  Postop diagnosis: The same  Operative procedure #1 bone biopsy middle phalanx right index finger through a mid lateral approach #2 bone biopsy distal phalanx to the mid lateral incision right index finger  Surgeon Laurie Schmidt  Anesthesia block with IV sedation  Cultures aerobic anaerobic fungal and atypical cultures were taken separately at the middle phalanx right index finger and distal phalanx right index finger.  These were bone cultures as described below.  Tourniquet time less than an hour   complications none  Description of procedure-patient was taken to the operative theater underwent IV sedation performed a Hibiclens prescrub followed by 10-minute surgical Betadine scrub.  Timeout was observed.  The patient was comfortable.  There were no complicating features.  Under block anesthetic I performed a mid lateral incision.  Dissection was carried down about the radial mid lateral incision to the middle phalanx.  I did not disrupt digital nerves or arteries.  There is no gross purulent fluid in the soft tissues.  I did not feel any significant thickness in the flexor sheath either.  Dissection was carried down to the middle phalanx small awl was used to open the bony cortex followed by cultures taken utilizing house curette and gathering bony metaphyseal bone elements.  I was able to get a nice good culture of the middle phalanx.  I irrigated this copiously.  Following this separate incision was extended dissection was carried down to the distal phalanx and similar opening of the cortex about the radial aspect was accomplished followed by a bony culture of the distal phalanx.  I was able to get good cultures.  Both aerobic and anaerobic cultures were performed and the specimens were sent separately so  that we can identify the middle phalanx and the distal phalanx.  Following this I irrigated copiously then deflated tourniquet and ultimately closed wound after hemostasis was secure.  The bone was stable in its quality.  There is no obvious infection or fluid to indicate a infectious type fluid mixture.  There is no purulence or infectious fluid in the flexor sheath or soft tissues.  The patient does have a generally stiff finger of course and we are aware of this.  The patient tolerated the procedure well after closure of the wound patient was placed in a standard dressing of Adaptic Xeroform gauze 4 x 4's and a finger splint.  I will see her back in the office in a week for dressing change.  She will be discharged home today.  She has been off her antibiotics for 2 weeks.  I have asked her to notify me should any problems occur.  All questions have been addressed.  Reagan Behlke MD

## 2020-02-05 NOTE — Progress Notes (Signed)
Assisted Dr. Rob Fitzgerald with right, ultrasound guided, axillary block. Side rails up, monitors on throughout procedure. See vital signs in flow sheet. Tolerated Procedure well. 

## 2020-02-05 NOTE — Transfer of Care (Signed)
Immediate Anesthesia Transfer of Care Note  Patient: CARIDAD SILVEIRA  Procedure(s) Performed: IRRIGATION AND DEBRIDEMENT RIGHT INDEX FINGER AND BONE BIOPSY (Right Finger)  Patient Location: PACU  Anesthesia Type:MAC and Regional  Level of Consciousness: awake and alert   Airway & Oxygen Therapy: Patient Spontanous Breathing and Patient connected to face mask oxygen  Post-op Assessment: Report given to RN and Post -op Vital signs reviewed and stable  Post vital signs: Reviewed and stable  Last Vitals:  Vitals Value Taken Time  BP 100/68 02/05/20 1315  Temp    Pulse 69 02/05/20 1315  Resp 15 02/05/20 1315  SpO2 100 % 02/05/20 1315  Vitals shown include unvalidated device data.  Last Pain:  Vitals:   02/05/20 1138  TempSrc: Oral  PainSc: 0-No pain         Complications: No complications documented.

## 2020-02-05 NOTE — H&P (Signed)
Laurie Schmidt is an 59 y.o. female.   Chief Complaint: Right index finger pain and swelling HPI: Right index finger injury with significant infection rule out deep osteomyelitic involvement.  We will plan for biopsy today. Patient presents for evaluation and treatment of the of their upper extremity predicament. The patient denies neck, back, chest or  abdominal pain. The patient notes that they have no lower extremity problems. The patients primary complaint is noted. We are planning surgical care pathway for the upper extremity.  Past Medical History:  Diagnosis Date  . Allergy   . COPD (chronic obstructive pulmonary disease) (HCC)    no meds    Past Surgical History:  Procedure Laterality Date  . ABDOMINAL HYSTERECTOMY    . BREAST SURGERY    . DILATION AND CURETTAGE OF UTERUS    . I & D EXTREMITY Right 11/23/2019   Procedure: IRRIGATION AND DEBRIDEMENT EXTREMITY, right index finger;  Surgeon: Dominica Severin, MD;  Location: [facility] OR;  Service: Orthopedics;  Laterality: Right;  . I & D EXTREMITY Right 11/25/2019   Procedure: REPEAT IRRIGATION AND DEBRIDEMENT RIGHT INDEX FINGER;  Surgeon: Dominica Severin, MD;  Location: [facility] OR;  Service: Orthopedics;  Laterality: Right;  60 mins    Family History  Problem Relation Age of Onset  . Cancer Father        melanoma  . Breast cancer Paternal Aunt   . Heart attack Maternal Grandmother   . Breast cancer Maternal Grandmother   . Heart attack Paternal Grandmother   . Heart attack Paternal Grandfather   . Healthy Mother    Social History:  reports that she quit smoking about 5 years ago. Her smoking use included cigarettes. She has a 31.00 pack-year smoking history. She has never used smokeless tobacco. She reports current alcohol use. She reports that she does not use drugs.  Allergies: No Known Allergies  Medications Prior to Admission  Medication Sig Dispense Refill  . Ascorbic Acid (VITAMIN C) 100 MG tablet Take 100 mg by mouth daily.     Marland Kitchen b complex vitamins tablet Take 1 tablet by mouth daily.    . Calcium Carb-Cholecalciferol (CALCIUM 500+D PO) Take 1 tablet by mouth daily.     . cholecalciferol (VITAMIN D) 1000 units tablet Take 1,000 Units by mouth daily.    . Coenzyme Q10 (COQ10) 100 MG CAPS Take 100 mg by mouth daily.     Marland Kitchen ibuprofen (ADVIL,MOTRIN) 200 MG tablet Take 200 mg by mouth every 6 (six) hours as needed for moderate pain.     Marland Kitchen loratadine (CLARITIN) 10 MG tablet Take 10 mg by mouth daily.    . Probiotic Product (PROBIOTIC DAILY PO) Take 1 capsule by mouth daily.    . Red Yeast Rice 600 MG CAPS Take 1 capsule by mouth daily.     Marland Kitchen oxyCODONE (ROXICODONE) 5 MG immediate release tablet Take 1 tablet (5 mg total) by mouth every 4 (four) hours as needed. (Patient not taking: Reported on 01/20/2020) 40 tablet 0    Results for orders placed or performed during the hospital encounter of 02/03/20 (from the past 48 hour(s))  SARS CORONAVIRUS 2 (TAT 6-24 HRS) Nasopharyngeal Nasopharyngeal Swab     Status: None   Collection Time: 02/03/20  3:06 PM   Specimen: Nasopharyngeal Swab  Result Value Ref Range   SARS Coronavirus 2 NEGATIVE NEGATIVE    Comment: (NOTE) SARS-CoV-2 target nucleic acids are NOT DETECTED.  The SARS-CoV-2 RNA is generally detectable in upper  and lower respiratory specimens during the acute phase of infection. Negative results do not preclude SARS-CoV-2 infection, do not rule out co-infections with other pathogens, and should not be used as the sole basis for treatment or other patient management decisions. Negative results must be combined with clinical observations, patient history, and epidemiological information. The expected result is Negative.  Fact Sheet for Patients: HairSlick.no  Fact Sheet for Healthcare Providers: quierodirigir.com  This test is not yet approved or cleared by the [place] FDA and  has been authorized for  detection and/or diagnosis of SARS-CoV-2 by FDA under an Emergency Use Authorization (EUA). This EUA will remain  in effect (meaning this test can be used) for the duration of the COVID-19 declaration under Se ction 564(b)(1) of the Act, 21 U.S.C. section 360bbb-3(b)(1), unless the authorization is terminated or revoked sooner.  Performed at [facility], [address] [place], [place] [address]    No results found.  Review of Systems  Respiratory: Negative.   Cardiovascular: Negative.     Blood pressure 106/70, pulse 62, temperature 97.7 F (36.5 C), temperature source Oral, resp. rate 15, height 5\' 10"  (1.778 m), weight 72.6 kg, SpO2 100 %. Physical Exam  Right index finger pain and swelling.  History of surgical reconstruction and irrigation debridement.  Rule out infectious osteomyelitic focus.  Labs have been benign MRI suggest inflammatory changes in the bone.  We discussed with infectious disease and will plan for a biopsy.  She has been off her antibiotics for 2 weeks.  The patient is alert and oriented in no acute distress. The patient complains of pain in the affected upper extremity.  The patient is noted to have a normal HEENT exam. Lung fields show equal chest expansion and no shortness of breath. Abdomen exam is nontender without distention. Lower extremity examination does not show any fracture dislocation or blood clot symptoms. Pelvis is stable and the neck and back are stable and nontender. Assessment/Plan We are planning surgery for your upper extremity. The risk and benefits of surgery to include risk of bleeding, infection, anesthesia,  damage to normal structures and failure of the surgery to accomplish its intended goals of relieving symptoms and restoring function have been discussed in detail. With this in mind we plan to proceed. I have specifically discussed with the patient the pre-and postoperative regime and the dos and don'ts and risk and benefits  in great detail. Risk and benefits of surgery also include risk of dystrophy(CRPS), chronic nerve pain, failure of the healing process to go onto completion and other inherent risks of surgery The relavent the pathophysiology of the disease/injury process, as well as the alternatives for treatment and postoperative course of action has been discussed in great detail with the patient who desires to proceed.  We will do everything in our power to help you (the patient) restore function to the upper extremity. It is a pleasure to see this patient today.   We will plan for right index finger irrigation debridement and bone biopsy.  III, MD 02/05/2020, 12:20 PM

## 2020-02-05 NOTE — Anesthesia Postprocedure Evaluation (Signed)
Anesthesia Post Note  Patient: Laurie Schmidt  Procedure(s) Performed: IRRIGATION AND DEBRIDEMENT RIGHT INDEX FINGER AND BONE BIOPSY (Right Finger)     Patient location during evaluation: PACU Anesthesia Type: Regional Level of consciousness: awake and alert Pain management: pain level controlled Vital Signs Assessment: post-procedure vital signs reviewed and stable Respiratory status: spontaneous breathing, nonlabored ventilation, respiratory function stable and patient connected to nasal cannula oxygen Cardiovascular status: stable and blood pressure returned to baseline Postop Assessment: no apparent nausea or vomiting Anesthetic complications: no   No complications documented.  Last Vitals:  Vitals:   02/05/20 1400 02/05/20 1600  BP: 108/69 109/67  Pulse: 61 64  Resp: 12 16  Temp:  36.4 C  SpO2: 98% 98%    Last Pain:  Vitals:   02/05/20 1600  TempSrc:   PainSc: 0-No pain                 [name]

## 2020-02-05 NOTE — Anesthesia Preprocedure Evaluation (Addendum)
Anesthesia Evaluation  Patient identified by MRN, date of birth, ID band Patient awake    Reviewed: Allergy & Precautions, NPO status , Patient's Chart, lab work & pertinent test results  Airway Mallampati: II  TM Distance: >3 FB Neck ROM: Full    Dental  (+) Dental Advisory Given   Pulmonary COPD, former smoker,    breath sounds clear to auscultation       Cardiovascular negative cardio ROS   Rhythm:Regular Rate:Normal     Neuro/Psych negative neurological ROS     GI/Hepatic negative GI ROS, Neg liver ROS,   Endo/Other  negative endocrine ROS  Renal/GU negative Renal ROS     Musculoskeletal   Abdominal   Peds  Hematology negative hematology ROS (+)   Anesthesia Other Findings   Reproductive/Obstetrics                             Anesthesia Physical Anesthesia Plan  ASA: II  Anesthesia Plan: Regional and MAC   Post-op Pain Management:    Induction:   PONV Risk Score and Plan: 2 and Propofol infusion, Ondansetron, Treatment may vary due to age or medical condition and Dexamethasone  Airway Management Planned: Natural Airway and Simple Face Mask  Additional Equipment:   Intra-op Plan:   Post-operative Plan:   Informed Consent: I have reviewed the patients History and Physical, chart, labs and discussed the procedure including the risks, benefits and alternatives for the proposed anesthesia with the patient or authorized representative who has indicated his/her understanding and acceptance.       Plan Discussed with: CRNA  Anesthesia Plan Comments:         Anesthesia Quick Evaluation

## 2020-02-08 ENCOUNTER — Other Ambulatory Visit: Payer: Self-pay

## 2020-02-08 ENCOUNTER — Ambulatory Visit (INDEPENDENT_AMBULATORY_CARE_PROVIDER_SITE_OTHER): Payer: Commercial Managed Care - PPO | Admitting: Internal Medicine

## 2020-02-08 ENCOUNTER — Encounter: Payer: Self-pay | Admitting: Internal Medicine

## 2020-02-08 DIAGNOSIS — L02511 Cutaneous abscess of right hand: Secondary | ICD-10-CM | POA: Diagnosis not present

## 2020-02-08 NOTE — Progress Notes (Signed)
[facility]  Patient Active Problem List   Diagnosis Date Noted   Abscess of finger of right hand 11/23/2019    Patient's Medications  New Prescriptions   No medications on file  Previous Medications   AMOXICILLIN-CLAVULANATE (AUGMENTIN) 875-125 MG TABLET    Take 1 tablet by mouth 2 (two) times daily for 14 days.   ASCORBIC ACID (VITAMIN C) 100 MG TABLET    Take 100 mg by mouth daily.   B COMPLEX VITAMINS TABLET    Take 1 tablet by mouth daily.   CALCIUM CARB-CHOLECALCIFEROL (CALCIUM 500+D PO)    Take 1 tablet by mouth daily.    CHOLECALCIFEROL (VITAMIN D) 1000 UNITS TABLET    Take 1,000 Units by mouth daily.   COENZYME Q10 (COQ10) 100 MG CAPS    Take 100 mg by mouth daily.    IBUPROFEN (ADVIL,MOTRIN) 200 MG TABLET    Take 200 mg by mouth every 6 (six) hours as needed for moderate pain.    LORATADINE (CLARITIN) 10 MG TABLET    Take 10 mg by mouth daily.   OXYCODONE (ROXICODONE) 5 MG IMMEDIATE RELEASE TABLET    Take 1 tablet (5 mg total) by mouth every 4 (four) hours as needed.   OXYCODONE (ROXICODONE) 5 MG IMMEDIATE RELEASE TABLET    Take 1 tablet (5 mg total) by mouth every 4 (four) hours as needed.   PROBIOTIC PRODUCT (PROBIOTIC DAILY PO)    Take 1 capsule by mouth daily.   RED YEAST RICE 600 MG CAPS    Take 1 capsule by mouth daily.   Modified Medications   No medications on file  Discontinued Medications   No medications on file    Subjective: Laurie Schmidt is in for her routine follow-up visit.  She had sudden onset of pain and swelling around her right index finger nailbed on 11/15/2019 after spending several hours working in her garden and painting a backyard fence.  She had her garden gloves on during these activities.  She was seen at a local urgent care and underwent incision and drainage and was started on trimethoprim sulfamethoxazole on 11/18/2019.  She did not improve and was seen at another urgent care before being referred to Dr. Amanda Pea.   He performed incision and drainage on 11/23/2019 and described tenosynovitis.  Cultures grew strep intermedius, Eikenella and Prevotella (2 species).  She was started on amoxicillin clavulanate upon discharge.  She improved but continued to have diffuse swelling of her right index finger with discoloration.  She had pain in the distal two thirds of her finger.  She underwent an MRI on 01/09/2020 which showed:  Marrow edema changes within the distal phalanx and less so distal aspect of the proximal phalanx of the second digit with surrounding soft tissue inflammation and mild irregularity and deformity of the joint.  The subcutis tissues are diffusely inflamed.  Differential lies between inflammatory arthropathy and treated or partially treated infection.  Former slightly favored.  Continued follow-up and clinical correlation and laboratory correlation would be of value.  I saw her for the first time on 01/20/2020.  I recommended stopping amoxicillin clavulanate at that time and proceeding with repeat I&D including bone biopsy.  Dr. Amanda Pea did that on 02/05/2020.  No gross purulence was encountered.  The pathology report is pending.  No organisms were seen on Gram stain and cultures are negative at 48 hours.  AFB and fungal stains and cultures are pending.  She tells me that there was no change in the swelling in this coloration between the time of her visit with me and her surgery.  She started back on amoxicillin clavulanate postoperatively.  Review of Systems: Review of Systems  Constitutional: Negative for fever.  Gastrointestinal: Negative for abdominal pain, diarrhea, nausea and vomiting.  Musculoskeletal: Positive for joint pain.    Past Medical History:  Diagnosis Date   Allergy    COPD (chronic obstructive pulmonary disease) (HCC)    no meds    Social History   Tobacco Use   Smoking status: Former Smoker    Packs/day: 1.00    Years: 31.00    Pack years: 31.00    Types: Cigarettes     Quit date: 07/12/2014    Years since quitting: 5.5   Smokeless tobacco: Never Used  Vaping Use   Vaping Use: Never used  Substance Use Topics   Alcohol use: Yes    Alcohol/week: 0.0 standard drinks    Comment: glass of wine or 2 with dinner   Drug use: Never    Family History  Problem Relation Age of Onset   Cancer Father        melanoma   Breast cancer Paternal Aunt    Heart attack Maternal Grandmother    Breast cancer Maternal Grandmother    Heart attack Paternal Grandmother    Heart attack Paternal Grandfather    Healthy Mother     No Known Allergies  Objective: Vitals:   02/08/20 1054 02/08/20 1056  BP:  103/71  Pulse:  77  Temp:  98.6 F (37 C)  TempSrc:  Oral  SpO2:  96%  Weight: 162 lb (73.5 kg) 162 lb (73.5 kg)  Height:  5\' 10"  (1.778 m)   Body mass index is 23.24 kg/m.  Physical Exam Constitutional:      Comments: She is very pleasant and in only mild distress due to pain in her right hand.  Musculoskeletal:     Comments: Her right hand and index finger are still in her bulky surgical dressing.     Lab Results Sed Rate (mm/h)  Date Value  01/20/2020 2   CRP (mg/L)  Date Value  01/20/2020 0.5     Problem List Items Addressed This Visit      Unprioritized   Abscess of finger of right hand    The cause of her persistent right index finger swelling remains unclear.  Her inflammatory markers were normal at the time of her first visit on 01/20/2020.  I will continue amoxicillin clavulanate pending the biopsy report and final results of her routine cultures later this week.          01/22/2020, MD [facility] [phone] pager   [phone] cell 02/08/2020, 11:47 AM

## 2020-02-08 NOTE — Assessment & Plan Note (Signed)
The cause of her persistent right index finger swelling remains unclear.  Her inflammatory markers were normal at the time of her first visit on 01/20/2020.  I will continue amoxicillin clavulanate pending the biopsy report and final results of her routine cultures later this week.

## 2020-02-08 NOTE — Addendum Note (Signed)
Addendum  created 02/08/20 1105 by Marcene Duos, MD   Clinical Note Signed, Intraprocedure Blocks edited

## 2020-02-10 LAB — AEROBIC/ANAEROBIC CULTURE W GRAM STAIN (SURGICAL/DEEP WOUND)
Culture: NO GROWTH
Culture: NO GROWTH
Gram Stain: NONE SEEN

## 2020-02-11 LAB — ACID FAST SMEAR (AFB, MYCOBACTERIA)
Acid Fast Smear: NEGATIVE
Acid Fast Smear: NEGATIVE

## 2020-02-12 ENCOUNTER — Telehealth: Payer: Self-pay | Admitting: Internal Medicine

## 2020-02-12 NOTE — Telephone Encounter (Signed)
I called and spoke with Laurie Schmidt today. She has been treated for right index finger infection. Follow-up MRI 1 month ago showed some marrow edema in the proximal and distal phalanx. Her sed rate and C-reactive protein on 01/20/2020 were completely normal. She underwent repeat I&D on 02/05/2020. No purulence or soft bone was encountered. Gram, fungal and AFB stains were negative. Routine cultures are negative and final. I told her that there is a strong possibility her infection has been cured and the changes on MRI were simply reactive changes of treated infection. I also gave her the option of extending therapy with levofloxacin until all cultures were final. She elected to stop Augmentin now and start observation off of antibiotic therapy. She will follow-up with me on 03/10/2020.

## 2020-03-08 LAB — FUNGUS CULTURE WITH STAIN

## 2020-03-08 LAB — FUNGUS CULTURE RESULT

## 2020-03-08 LAB — FUNGAL ORGANISM REFLEX

## 2020-03-09 ENCOUNTER — Telehealth: Payer: Self-pay | Admitting: *Deleted

## 2020-03-09 NOTE — Telephone Encounter (Signed)
Per Dr Orvan Falconer, left message for patient to see if she could come 9/2 at 3:30 instead of 4:15.  Will send mychart message as well.  Andree Coss, RN

## 2020-03-10 ENCOUNTER — Encounter: Payer: Self-pay | Admitting: Internal Medicine

## 2020-03-10 ENCOUNTER — Ambulatory Visit (INDEPENDENT_AMBULATORY_CARE_PROVIDER_SITE_OTHER): Payer: Commercial Managed Care - PPO | Admitting: Internal Medicine

## 2020-03-10 ENCOUNTER — Other Ambulatory Visit: Payer: Self-pay

## 2020-03-10 DIAGNOSIS — L02511 Cutaneous abscess of right hand: Secondary | ICD-10-CM

## 2020-03-10 NOTE — Progress Notes (Signed)
[facility]  Patient Active Problem List   Diagnosis Date Noted  . Abscess of finger of right hand 11/23/2019    Patient's Medications  New Prescriptions   No medications on file  Previous Medications   ASCORBIC ACID (VITAMIN C) 100 MG TABLET    Take 100 mg by mouth daily.   B COMPLEX VITAMINS TABLET    Take 1 tablet by mouth daily.   CALCIUM CARB-CHOLECALCIFEROL (CALCIUM 500+D PO)    Take 1 tablet by mouth daily.    CHOLECALCIFEROL (VITAMIN D) 1000 UNITS TABLET    Take 1,000 Units by mouth daily.   COENZYME Q10 (COQ10) 100 MG CAPS    Take 100 mg by mouth daily.    IBUPROFEN (ADVIL,MOTRIN) 200 MG TABLET    Take 200 mg by mouth every 6 (six) hours as needed for moderate pain.    LORATADINE (CLARITIN) 10 MG TABLET    Take 10 mg by mouth daily.   OXYCODONE (ROXICODONE) 5 MG IMMEDIATE RELEASE TABLET    Take 1 tablet (5 mg total) by mouth every 4 (four) hours as needed.   OXYCODONE (ROXICODONE) 5 MG IMMEDIATE RELEASE TABLET    Take 1 tablet (5 mg total) by mouth every 4 (four) hours as needed.   PROBIOTIC PRODUCT (PROBIOTIC DAILY PO)    Take 1 capsule by mouth daily.   RED YEAST RICE 600 MG CAPS    Take 1 capsule by mouth daily.   Modified Medications   No medications on file  Discontinued Medications   No medications on file    Subjective: Laurie Schmidt is in for her routine follow-up visit.  She had sudden onset of pain and swelling around her right index finger nailbed on 11/15/2019 after spending several hours working in her garden and painting a backyard fence.  She had her garden gloves on during these activities.  She was seen at a local urgent care and underwent incision and drainage and was started on trimethoprim sulfamethoxazole on 11/18/2019.  She did not improve and was seen at another urgent care before being referred to Dr. Amanda Pea.  He performed incision and drainage on 11/23/2019 and described tenosynovitis.  Cultures grew strep intermedius,  Eikenella and Prevotella (2 species).  She was started on amoxicillin clavulanate upon discharge.  She improved but continued to have diffuse swelling of her right index finger with discoloration.  She had pain in the distal two thirds of her finger.  She underwent an MRI on 01/09/2020 which showed:  Marrow edema changes within the distal phalanx and less so distal aspect of the proximal phalanx of the second digit with surrounding soft tissue inflammation and mild irregularity and deformity of the joint.  The subcutis tissues are diffusely inflamed.  Differential lies between inflammatory arthropathy and treated or partially treated infection.  Former slightly favored.  Continued follow-up and clinical correlation and laboratory correlation would be of value.  I saw her for the first time on 01/20/2020.  I recommended stopping amoxicillin clavulanate at that time and proceeding with repeat I&D including bone biopsy.  Dr. Amanda Pea did that on 02/05/2020.  No gross purulence was encountered.  No organisms were seen on Gram,  AFB or fungal stains and all cultures are negative.  She started back on amoxicillin clavulanate postoperatively but I instructed her to stop on 02/12/2020.  She is back to work full-time and doing physical therapy.  She thinks she has had some increased swelling  and discomfort in her finger over the past few weeks.  Review of Systems: Review of Systems  Constitutional: Negative for fever.  Gastrointestinal: Negative for abdominal pain, diarrhea, nausea and vomiting.  Musculoskeletal: Positive for joint pain.    Past Medical History:  Diagnosis Date  . Allergy   . COPD (chronic obstructive pulmonary disease) (HCC)    no meds    Social History   Tobacco Use  . Smoking status: Former Smoker    Packs/day: 1.00    Years: 31.00    Pack years: 31.00    Types: Cigarettes    Quit date: 07/12/2014    Years since quitting: 5.6  . Smokeless tobacco: Never Used  Vaping Use  . Vaping  Use: Never used  Substance Use Topics  . Alcohol use: Yes    Alcohol/week: 0.0 standard drinks    Comment: glass of wine or 2 with dinner  . Drug use: Never    Family History  Problem Relation Age of Onset  . Cancer Father        melanoma  . Breast cancer Paternal Aunt   . Heart attack Maternal Grandmother   . Breast cancer Maternal Grandmother   . Heart attack Paternal Grandmother   . Heart attack Paternal Grandfather   . Healthy Mother     No Known Allergies  Objective: Vitals:   03/10/20 1550  BP: (!) 145/94  Pulse: 67  Temp: (!) 97.5 F (36.4 C)  TempSrc: Oral  SpO2: 100%  Weight: 159 lb (72.1 kg)  Height: 5\' 10"  (1.778 m)   Body mass index is 22.81 kg/m.  Physical Exam Constitutional:      Comments: She is in good spirits.  Musculoskeletal:     Comments: She still has some mild, diffuse swelling of her right index finger but there is no unusual redness.  All incisions have healed nicely.  She still has limited flexion.     Lab Results Sed Rate (mm/h)  Date Value  01/20/2020 2   CRP (mg/L)  Date Value  01/20/2020 0.5     Problem List Items Addressed This Visit      Unprioritized   Abscess of finger of right hand    I believe her infection has been cured.  She can follow-up here as needed.          01/22/2020, MD [facility] [phone] pager   [phone] cell 03/10/2020, 4:16 PM

## 2020-03-10 NOTE — Assessment & Plan Note (Signed)
I believe her infection has been cured.  She can follow-up here as needed.

## 2020-03-25 LAB — ACID FAST CULTURE WITH REFLEXED SENSITIVITIES (MYCOBACTERIA): Acid Fast Culture: NEGATIVE

## 2020-04-05 LAB — ACID FAST CULTURE WITH REFLEXED SENSITIVITIES (MYCOBACTERIA): Acid Fast Culture: NEGATIVE

## 2020-04-29 ENCOUNTER — Other Ambulatory Visit: Payer: Self-pay

## 2020-04-29 ENCOUNTER — Ambulatory Visit (INDEPENDENT_AMBULATORY_CARE_PROVIDER_SITE_OTHER)
Admission: RE | Admit: 2020-04-29 | Discharge: 2020-04-29 | Disposition: A | Discharge status: Home / Self Care | Payer: Commercial Managed Care - PPO | Source: Ambulatory Visit | Attending: Acute Care | Admitting: Acute Care

## 2020-04-29 DIAGNOSIS — Z87891 Personal history of nicotine dependence: Secondary | ICD-10-CM

## 2020-04-29 DIAGNOSIS — Z122 Encounter for screening for malignant neoplasm of respiratory organs: Secondary | ICD-10-CM

## 2020-05-05 NOTE — Progress Notes (Signed)
Please call patient and let them  know their  low dose Ct was read as a Lung RADS 2: nodules that are benign in appearance and behavior with a very low likelihood of becoming a clinically active cancer due to size or lack of growth. Recommendation per radiology is for a repeat LDCT in 12 months. .Please let them  know we will order and schedule their  annual screening scan for 04/2021. Please let them  know there was notation of CAD on their  scan.  Please remind the patient  that this is a non-gated exam therefore degree or severity of disease  cannot be determined. Please have them  follow up with their PCP regarding potential risk factor modification, dietary therapy or pharmacologic therapy if clinically indicated. Pt.  is not currently on statin therapy. Please place order for annual  screening scan for  04/2021 and fax results to PCP. Thanks so much. 

## 2020-05-13 ENCOUNTER — Telehealth: Payer: Self-pay | Admitting: Acute Care

## 2020-05-13 DIAGNOSIS — Z87891 Personal history of nicotine dependence: Secondary | ICD-10-CM

## 2020-05-16 NOTE — Telephone Encounter (Signed)
LMOM per pt request with CT results per Sarah Groce, NP.  Copy sent to PCP.  Order placed for 1 yr f/u CT.  

## 2020-05-23 ENCOUNTER — Encounter: Payer: Self-pay | Admitting: Obstetrics & Gynecology

## 2020-05-24 ENCOUNTER — Encounter (INDEPENDENT_AMBULATORY_CARE_PROVIDER_SITE_OTHER): Payer: Self-pay | Admitting: Otolaryngology

## 2020-05-24 ENCOUNTER — Ambulatory Visit (INDEPENDENT_AMBULATORY_CARE_PROVIDER_SITE_OTHER): Payer: Commercial Managed Care - PPO | Admitting: Otolaryngology

## 2020-05-24 ENCOUNTER — Other Ambulatory Visit: Payer: Self-pay

## 2020-05-24 VITALS — Temp 97.2°F

## 2020-05-24 DIAGNOSIS — H6121 Impacted cerumen, right ear: Secondary | ICD-10-CM

## 2020-05-24 NOTE — Progress Notes (Signed)
HPI: Laurie Schmidt is a 59 y.o. female who presents is referred by her PCP for evaluation of wax buildup in her ears.  She has had some ear discomfort as well as slight decrease hearing.  She was seen by her PCP who referred her here to have her right ear cleaned..  Past Medical History:  Diagnosis Date  . Allergy   . COPD (chronic obstructive pulmonary disease) (HCC)    no meds   Past Surgical History:  Procedure Laterality Date  . ABDOMINAL HYSTERECTOMY    . BREAST SURGERY    . DILATION AND CURETTAGE OF UTERUS    . I & D EXTREMITY Right 11/23/2019   Procedure: IRRIGATION AND DEBRIDEMENT EXTREMITY, right index finger;  Surgeon: Dominica Severin, MD;  Location: [facility] OR;  Service: Orthopedics;  Laterality: Right;  . I & D EXTREMITY Right 11/25/2019   Procedure: REPEAT IRRIGATION AND DEBRIDEMENT RIGHT INDEX FINGER;  Surgeon: Dominica Severin, MD;  Location: [facility] OR;  Service: Orthopedics;  Laterality: Right;  60 mins  . I & D EXTREMITY Right 02/05/2020   Procedure: IRRIGATION AND DEBRIDEMENT RIGHT INDEX FINGER AND BONE BIOPSY;  Surgeon: Dominica Severin, MD;  Location:  SURGERY CENTER;  Service: Orthopedics;  Laterality: Right;  60 mins Block with IV Sed   Social History   Socioeconomic History  . Marital status: Single    Spouse name: Not on file  . Number of children: Not on file  . Years of education: Not on file  . Highest education level: Not on file  Occupational History  . Not on file  Tobacco Use  . Smoking status: Former Smoker    Packs/day: 1.00    Years: 31.00    Pack years: 31.00    Types: Cigarettes    Quit date: 07/12/2014    Years since quitting: 5.8  . Smokeless tobacco: Never Used  Vaping Use  . Vaping Use: Never used  Substance and Sexual Activity  . Alcohol use: Yes    Alcohol/week: 0.0 standard drinks    Comment: glass of wine or 2 with dinner  . Drug use: Never  . Sexual activity: Not Currently    Partners: Male    Comment: 1st intercourse-  20,    Other Topics Concern  . Not on file  Social History Narrative  . Not on file   Social Determinants of Health   Financial Resource Strain:   . Difficulty of Paying Living Expenses: Not on file  Food Insecurity:   . Worried About Programme researcher, broadcasting/film/video in the Last Year: Not on file  . Ran Out of Food in the Last Year: Not on file  Transportation Needs:   . Lack of Transportation (Medical): Not on file  . Lack of Transportation (Non-Medical): Not on file  Physical Activity:   . Days of Exercise per Week: Not on file  . Minutes of Exercise per Session: Not on file  Stress:   . Feeling of Stress : Not on file  Social Connections:   . Frequency of Communication with Friends and Family: Not on file  . Frequency of Social Gatherings with Friends and Family: Not on file  . Attends Religious Services: Not on file  . Active Member of Clubs or Organizations: Not on file  . Attends Banker Meetings: Not on file  . Marital Status: Not on file   Family History  Problem Relation Age of Onset  . Cancer Father  melanoma  . Breast cancer Paternal Aunt   . Heart attack Maternal Grandmother   . Breast cancer Maternal Grandmother   . Heart attack Paternal Grandmother   . Heart attack Paternal Grandfather   . Healthy Mother    No Known Allergies Prior to Admission medications   Medication Sig Start Date End Date Taking? Authorizing Provider  Ascorbic Acid (VITAMIN C) 100 MG tablet Take 100 mg by mouth daily.   Yes [provider]  b complex vitamins tablet Take 1 tablet by mouth daily.   Yes [provider]  Calcium Carb-Cholecalciferol (CALCIUM 500+D PO) Take 1 tablet by mouth daily.    Yes [provider]  cholecalciferol (VITAMIN D) 1000 units tablet Take 1,000 Units by mouth daily.   Yes [provider]  Coenzyme Q10 (COQ10) 100 MG CAPS Take 100 mg by mouth daily.    Yes [provider]  ibuprofen (ADVIL,MOTRIN) 200 MG tablet  Take 200 mg by mouth every 6 (six) hours as needed for moderate pain.    Yes [provider]  loratadine (CLARITIN) 10 MG tablet Take 10 mg by mouth daily.   Yes [provider]  Probiotic Product (PROBIOTIC DAILY PO) Take 1 capsule by mouth daily.   Yes [provider]  Red Yeast Rice 600 MG CAPS Take 1 capsule by mouth daily.    Yes [provider]  oxyCODONE (ROXICODONE) 5 MG immediate release tablet Take 1 tablet (5 mg total) by mouth every 4 (four) hours as needed. Patient not taking: Reported on 02/08/2020 11/28/19 11/27/20  Dominica Severin, MD  oxyCODONE (ROXICODONE) 5 MG immediate release tablet Take 1 tablet (5 mg total) by mouth every 4 (four) hours as needed. 02/05/20 02/04/21  Dominica Severin, MD     Positive ROS: Otherwise negative  All other systems have been reviewed and were otherwise negative with the exception of those mentioned in the HPI and as above.  Physical Exam: Constitutional: Alert, well-appearing, no acute distress Ears: External ears without lesions or tenderness.  Left ear canal is clear with a clear left TM.  Right ear canal is completely occluded with cerumen that was removed with forceps and curettes.  After cleaning the ear canal the right TM likewise was clear.  On hearing screening with the 512 1024 tuning fork she had a mild sensorineural hearing loss in both ears with a 1024 tuning fork. Nasal: External nose without lesions.. Clear nasal passages Oral: Lips and gums without lesions. Tongue and palate mucosa without lesions. Posterior oropharynx clear. Neck: No palpable adenopathy or masses Respiratory: Breathing comfortably  Skin: No facial/neck lesions or rash noted.  Cerumen impaction removal  Date/Time: 05/24/2020 5:22 PM Performed by: Drema Halon, MD Authorized by: Drema Halon, MD   Consent:    Consent obtained:  Verbal   Consent given by:  Patient   Risks discussed:  Pain and  bleeding Procedure details:    Location:  R ear   Procedure type: curette and forceps   Post-procedure details:    Inspection:  TM intact and canal normal   Hearing quality:  Improved   Patient tolerance of procedure:  Tolerated well, no immediate complications Comments:     TMs were clear bilaterally.    Assessment: Right cerumen impaction. Mild upper frequency hearing loss in both ears on tuning fork testing with a 1024 tuning fork.  Plan: Ear was cleaned in the office today.  Reassured patient of normal TMs otherwise. On tuning fork  testing she does have some mild hearing loss but does not notice any difficulty with her hearing.  If she notices any problems with her hearing would recommend obtaining audiologic testing. She will follow-up as needed.   Narda Bonds, MD   CC:

## 2020-05-31 NOTE — Telephone Encounter (Signed)
Spoke with [place] and rep said that is not a letter they would have sent. Mammo was normal on 05/23/20.  I called patient and talked with her. Tomorrow she will e-mail me the letter so I can look into it.

## 2020-06-01 ENCOUNTER — Encounter: Payer: Self-pay | Admitting: Gynecology

## 2020-06-14 ENCOUNTER — Telehealth: Payer: Self-pay

## 2020-06-14 DIAGNOSIS — Z1239 Encounter for other screening for malignant neoplasm of breast: Secondary | ICD-10-CM

## 2020-06-14 NOTE — Telephone Encounter (Signed)
Patient received a letter from [facility] stating that she was calculated to be at high risk for breast cancer and suggesting she might want to schedule a visit with a provider on a list "with expertise in breast health, high risk patient care and risk reduction strategies."  Patient emailed the letter and I put it on Dr. Simon Rhein desk. Dr. Mackey Birchwood sent it back to me with a note that she recommended Dr. Darnelle Catalan.  I emailed patient back and she would like referral to Dr. Darnelle Catalan.    Will forward to [facility] to handle referral. Patient informed.

## 2020-06-14 NOTE — Telephone Encounter (Signed)
Referral placed in epic. They will call to schedule.

## 2020-06-27 ENCOUNTER — Telehealth: Payer: Self-pay | Admitting: Oncology

## 2020-06-27 NOTE — Telephone Encounter (Signed)
Received a new referral from Dr. Seymour Bars for Ms. Laurie Schmidt to be seen in the high risk breast clinic. She returned my call and has been scheduled to see Dr. Darnelle Catalan on 1/17 at 4pm. Pt aware to arrive 30 minutes early.

## 2020-06-27 NOTE — Telephone Encounter (Signed)
Patient scheduled on 07/25/20 with Dr.Magrinat

## 2020-06-29 ENCOUNTER — Institutional Professional Consult (permissible substitution): Payer: Commercial Managed Care - PPO | Admitting: Obstetrics & Gynecology

## 2020-07-12 ENCOUNTER — Telehealth: Payer: Self-pay | Admitting: Oncology

## 2020-07-12 NOTE — Telephone Encounter (Signed)
Ms. Laurie Schmidt cld to reschedule her appt w/dr. Darnelle Catalan to 1/20 at 4pm.

## 2020-07-25 ENCOUNTER — Encounter: Payer: Commercial Managed Care - PPO | Admitting: Oncology

## 2020-07-27 NOTE — Progress Notes (Signed)
[place]  Telephone:(336) [phone] Fax:(336) [phone]     ID: [name] DOB: 21-Feb-1961  MR#: [id number]  PXT#:[id number]  Patient Care Team: Lawerance Cruel, MD as PCP - General Rozetta Nunnery, MD as Consulting Physician (Otolaryngology) Princess Bruins, MD as Consulting Physician (Obstetrics and Gynecology) Tuesday Terlecki, Virgie Dad, MD as Consulting Physician (Oncology) Magdalen Spatz, NP as Nurse Practitioner (Pulmonary Disease) Roseanne Kaufman, MD as Consulting Physician (Orthopedic Surgery) Chauncey Cruel, MD OTHER MD:  CHIEF COMPLAINT: high risk for breast cancer  CURRENT TREATMENT: intensified screening, tamoxifen   HISTORY OF CURRENT ILLNESS: Laurie Schmidt had routine screening mammography on 05/24/2020 at [facility] showing breast density category D.  Given that and the patient's family history she received a letter from [facility] stating she is at high risk for developing breast cancer and was referred here  The patient's subsequent history is as detailed below.   INTERVAL HISTORY: Laurie Schmidt was evaluated in the high risk breast cancer clinic on 07/28/2020 accompanied by her Sister Laurie Schmidt: Shylynn walks about 45 minutes most days.  She quit smoking about 6 years ago.  She has had a finger infection that has required repeated scans by Dr. Amedeo Plenty.  She has early cataracts.  Otherwise she denies unusual headaches visual changes cough phlegm production pleurisy shortness of breath or change in bowel or bladder habits.   COVID 19 VACCINATION STATUS: Moderna x2 with the booster in November 2021   PAST MEDICAL HISTORY: Past Medical History:  Diagnosis Date  . Allergy   . COPD (chronic obstructive pulmonary disease) ([place])    no meds    PAST SURGICAL HISTORY: Past Surgical History:  Procedure Laterality Date  . ABDOMINAL HYSTERECTOMY    . BREAST SURGERY    . DILATION AND CURETTAGE OF UTERUS    . I & D EXTREMITY Right  11/23/2019   Procedure: IRRIGATION AND DEBRIDEMENT EXTREMITY, right index finger;  Surgeon: Roseanne Kaufman, MD;  Location: [place];  Service: Orthopedics;  Laterality: Right;  . I & D EXTREMITY Right 11/25/2019   Procedure: REPEAT IRRIGATION AND DEBRIDEMENT RIGHT INDEX FINGER;  Surgeon: Roseanne Kaufman, MD;  Location: [place];  Service: Orthopedics;  Laterality: Right;  60 mins  . I & D EXTREMITY Right 02/05/2020   Procedure: IRRIGATION AND DEBRIDEMENT RIGHT INDEX FINGER AND BONE BIOPSY;  Surgeon: Roseanne Kaufman, MD;  Location: [place];  Service: Orthopedics;  Laterality: Right;  60 mins Block with IV Sed    FAMILY HISTORY: Family History  Problem Relation Age of Onset  . Cancer Father        melanoma  . Breast cancer Paternal Aunt   . Heart attack Maternal Grandmother   . Breast cancer Maternal Grandmother   . Heart attack Paternal Grandmother   . Heart attack Paternal Grandfather   . Healthy Mother   The patient's mother had atypical cells in her breast.  She underwent bilateral mastectomies.  Her mother, the patient's maternal grandmother had breast cancer diagnosed around age 43.  She died at age 25 with metastatic disease.  The patient's maternal grandfather did not have cancer.  The patient's mother had 2 sisters, and 3 brothers.  1 sister had breast cancer at approximately age 59.  In addition there is a maternal cousin with cancer of a type unknown to the patient.  On the father side the patient's father had melanoma and a paternal aunt had breast cancer at age 65.  Apparently it spread to her omentum.  The patient herself has no brothers, 1 sister who is 2 years older than she and does not have a history of breast cancer   GYNECOLOGIC HISTORY:  No LMP recorded. Patient has had a hysterectomy. Menarche: 60 years old Age at first live birth: 60 years old GX P 1 LMP at the time of hysterectomy age 42 Contraceptive more than 10 years without complications HRT never  used  Hysterectomy? yes BSO?  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 She lives at home with her mother who is currently 62 years old.  The patient's daughter Laurie Schmidt lives in [place] where she manages clinical trials.  The patient has no grandchildren.  She is a [employer]an   ADVANCED DIRECTIVES:    HEALTH MAINTENANCE: Social History   Tobacco Use  . Smoking status: Former Smoker    Packs/day: 1.00    Years: 31.00    Pack years: 31.00    Types: Cigarettes    Quit date: 07/12/2014    Years since quitting: 6.0  . Smokeless tobacco: Never Used  Vaping Use  . Vaping Use: Never used  Substance Use Topics  . Alcohol use: Yes    Alcohol/week: 0.0 standard drinks    Comment: glass of wine or 2 with dinner  . Drug use: Never     Colonoscopy: 2016/[facility]ini  PAP: 09/2017, negative  Bone density: 06/2018, T score -1.5   No Known Allergies  Current Outpatient Medications  Medication Sig Dispense Refill  . Ascorbic Acid (VITAMIN C) 100 MG tablet Take 100 mg by mouth daily.    . b complex vitamins tablet Take 1 tablet by mouth daily.    . Calcium Carb-Cholecalciferol (CALCIUM 500+D PO) Take 1 tablet by mouth daily.     . cholecalciferol (VITAMIN D) 1000 units tablet Take 1,000 Units by mouth daily.    . Coenzyme Q10 (COQ10) 100 MG CAPS Take 100 mg by mouth daily.     . ibuprofen (ADVIL,MOTRIN) 200 MG tablet Take 200 mg by mouth every 6 (six) hours as needed for moderate pain.     . loratadine (CLARITIN) 10 MG tablet Take 10 mg by mouth daily.    . Probiotic Product (PROBIOTIC DAILY PO) Take 1 capsule by mouth daily.    . Red Yeast Rice 600 MG CAPS Take 1 capsule by mouth daily.      No current facility-administered medications for this visit.    OBJECTIVE: White woman who appears stated age  Vitals:   07/28/20 1600  BP: 139/90  Pulse: 79  Resp: 20  Temp: (!) 97.5 F (36.4 C)  SpO2: 100%     Body mass index is  22.37 kg/m.   Wt Readings from Last 3 Encounters:  07/28/20 155 lb 14.4 oz (70.7 kg)  03/10/20 159 lb (72.1 kg)  02/08/20 162 lb (73.5 kg)      ECOG FS:1 - Symptomatic but completely ambulatory  Ocular: Sclerae unicteric, pupils round and equal Ear-nose-throat: Wearing a mask Lymphatic: No cervical or supraclavicular adenopathy Lungs no rales or rhonchi Heart regular rate and rhythm Abd soft, nontender, positive bowel sounds MSK no focal spinal tenderness, no joint edema Neuro: non-focal, well-oriented, appropriate affect Breasts: Status post bilateral saline implant placement remotely.  No suspicious masses, no skin or nipple changes of concern.  Both axillae are benign.   LAB RESULTS:  CMP     Component   Value Date/Time   NA 140 01/20/2020 1556   K 4.6 01/20/2020 1556   CL 106 01/20/2020 1556   CO2 24 01/20/2020 1556   GLUCOSE 88 01/20/2020 1556   BUN 14 01/20/2020 1556   CREATININE 0.91 01/20/2020 1556   CALCIUM 9.0 01/20/2020 1556   GFRNONAA >60 11/27/2019 0614   GFRAA >60 11/27/2019 0614    No results found for: TOTALPROTELP, ALBUMINELP, A1GS, A2GS, BETS, BETA2SER, GAMS, MSPIKE, SPEI  Lab Results  Component Value Date   WBC 6.6 01/20/2020   HGB 14.0 01/20/2020   HCT 40.6 01/20/2020   MCV 101.2 (H) 01/20/2020   PLT 203 01/20/2020    No results found for: LABCA2  No components found for: LABCAN125  No results for input(s): INR in the last 168 hours.  No results found for: LABCA2  No results found for: CAN199  No results found for: CAN125  No results found for: CAN153  No results found for: CA2729  No components found for: HGQUANT  No results found for: CEA1 / No results found for: CEA1   No results found for: AFPTUMOR  No results found for: CHROMOGRNA  No results found for: KPAFRELGTCHN, LAMBDASER, KAPLAMBRATIO (kappa/lambda light chains)  No results found for: HGBA, HGBA2QUANT, HGBFQUANT, HGBSQUAN (Hemoglobinopathy evaluation)   No  results found for: LDH  No results found for: IRON, TIBC, IRONPCTSAT (Iron and TIBC)  No results found for: FERRITIN  Urinalysis No results found for: COLORURINE, APPEARANCEUR, LABSPEC, PHURINE, GLUCOSEU, HGBUR, BILIRUBINUR, KETONESUR, PROTEINUR, UROBILINOGEN, NITRITE, LEUKOCYTESUR   STUDIES: No results found.    ELIGIBLE FOR AVAILABLE RESEARCH PROTOCOL: aet  ASSESSMENT: 59 y.o. [place], [place] woman with category D breast density and a strong family history, breast cancer risk approaching 25% according to the Tyer Cusick model  (1) intensified screening:  (a) mammography at [facility] every November  (b) breast MRI every May at [facility]  (2) risk reduction: Tamoxifen started 07/28/2020  (a) s/p hysterectomy  (b) bone density DEC 2019 shows osteopenia (T score - 1.5)  PLAN: I met today with Lee to review her overall situation.  We ran her Tyrer-Cuzick score which tells us she has an approximately 25% chance of developing breast cancer in her lifetime.  This is significantly increased from their average.   She has 2 ways of dealing with this.  One of them is intensified screening.  I think this is particularly appropriate given her breast density.  This involves continuing mammography every year in November but adding a breast MRI every year in May.  She understands concerns regarding cost and false positives but is very eager to start and I am writing for her first breast MRI to be done to be done this May at [facility]  The second way to deal with her breast cancer risk is to reduce the risk by taking an antiestrogen for 5 years.  We specifically discussed tamoxifen.  She has a good understanding of the possible toxicities side effects and complications of this agent.  She is a particularly good candidate because she is status post hysterectomy and because she took oral contraceptives for more than 10 years with no clotting complications.  She is very eager to start so  I am writing the prescription for her starting today.  Emanuela sister Debbie was very interested in starting a similar plan.  Obviously her breast cancer risk is likely to be very similar since they share a family history.  However Debbie is not my patient and   I really cannot prescribe for her.  She will discuss this with her gynecologist who can run a Tyrer-Cuzick screen confirming a risk greater than 20% and then proceed accordingly.  I will check virtually with Jalin late February to make sure she is tolerating tamoxifen well and then 1 more time after her breast MRI in May.  If everything is in place I will release her to her primary care physician's at that point  Renley has a good understanding of the overall plan. She will call with any problems that may develop before her next visit here.  Total encounter time 65 minutes.*  Gustav C. Magrinat, MD 07/28/2020 5:29 PM Medical Oncology and Hematology [facility] [address] [place], [place] [address] Tel. [phone]    Fax. [phone]   This document serves as a record of services personally performed by Gustav Magrinat, MD. It was created on his behalf by Katie Daubenspeck, a trained medical scribe. The creation of this record is based on the scribe's personal observations and the provider's statements to them.   I, Gustav Magrinat MD, have reviewed the above documentation for accuracy and completeness, and I agree with the above.    *Total Encounter Time as defined by the Centers for Medicare and Medicaid Services includes, in addition to the face-to-face time of a patient visit (documented in the note above) non-face-to-face time: obtaining and reviewing outside history, ordering and reviewing medications, tests or procedures, care coordination (communications with other health care professionals or caregivers) and documentation in the medical record.  

## 2020-07-28 ENCOUNTER — Other Ambulatory Visit: Payer: Self-pay

## 2020-07-28 ENCOUNTER — Inpatient Hospital Stay: Payer: Commercial Managed Care - PPO | Attending: Oncology | Admitting: Oncology

## 2020-07-28 DIAGNOSIS — Z79899 Other long term (current) drug therapy: Secondary | ICD-10-CM | POA: Insufficient documentation

## 2020-07-28 DIAGNOSIS — Z803 Family history of malignant neoplasm of breast: Secondary | ICD-10-CM | POA: Diagnosis not present

## 2020-07-28 DIAGNOSIS — R922 Inconclusive mammogram: Secondary | ICD-10-CM | POA: Diagnosis not present

## 2020-07-28 DIAGNOSIS — M8580 Other specified disorders of bone density and structure, unspecified site: Secondary | ICD-10-CM | POA: Insufficient documentation

## 2020-07-28 DIAGNOSIS — Z87891 Personal history of nicotine dependence: Secondary | ICD-10-CM | POA: Insufficient documentation

## 2020-07-28 DIAGNOSIS — Z9189 Other specified personal risk factors, not elsewhere classified: Secondary | ICD-10-CM | POA: Insufficient documentation

## 2020-07-28 DIAGNOSIS — J449 Chronic obstructive pulmonary disease, unspecified: Secondary | ICD-10-CM | POA: Insufficient documentation

## 2020-07-28 DIAGNOSIS — Z791 Long term (current) use of non-steroidal anti-inflammatories (NSAID): Secondary | ICD-10-CM | POA: Insufficient documentation

## 2020-07-28 DIAGNOSIS — Z1239 Encounter for other screening for malignant neoplasm of breast: Secondary | ICD-10-CM | POA: Insufficient documentation

## 2020-07-28 MED ORDER — TAMOXIFEN CITRATE 20 MG PO TABS: 20.0000 mg | ORAL_TABLET | Freq: Every day | ORAL | 12 refills | Status: AC

## 2020-08-01 ENCOUNTER — Telehealth: Payer: Self-pay | Admitting: Oncology

## 2020-08-01 NOTE — Telephone Encounter (Signed)
Scheduled appts per 1/20 los. Pt's mother confirmed appt date and time.

## 2020-09-04 NOTE — Progress Notes (Signed)
[facility]  Telephone:(336) [phone] Fax:(336) [phone]     ID: Laurie Schmidt DOB: March 16, 1961  MR#: [id number]  UVO#:[id number]  Patient Care Team: Daisy Floro, MD as PCP - General Drema Halon, MD as Consulting Physician (Otolaryngology) Genia Del, MD as Consulting Physician (Obstetrics and Gynecology) Jodilyn Giese, Valentino Hue, MD as Consulting Physician (Oncology) Bevelyn Ngo, NP as Nurse Practitioner (Pulmonary Disease) Dominica Severin, MD as Consulting Physician (Orthopedic Surgery) Lowella Dell, MD OTHER MD:  I connected with [name] on 09/05/20 at  1:45 PM EST by video enabled telemedicine visit and verified that I am speaking with the correct person using two identifiers.   I discussed the limitations, risks, security and privacy concerns of performing an evaluation and management service by telemedicine and the availability of in-person appointments. I also discussed with the patient that there may be a patient responsible charge related to this service. The patient expressed understanding and agreed to proceed.   Other persons participating in the visit and their role in the encounter: None  Patient's location: Home Provider's location: [facility]  Total time spent: 15 min   CHIEF COMPLAINT: high risk for breast cancer  CURRENT TREATMENT: intensified screening, tamoxifen   INTERVAL HISTORY: Laurie Schmidt was contacted today for follow up of her high risk for breast cancer. She was evaluated in the high risk breast cancer clinic on 07/28/2020.  She was started on tamoxifen at her last visit on 07/28/2020.  She is tolerating this well.  She has not noticed any new symptoms or side effects that she is aware of.  She was having some hot flashes before and they are no worse than before.  They do wake her up at night.  She has had no problems with vaginal wetness.   REVIEW OF SYSTEMS: Laurie Schmidt is doing well otherwise  detailed review of systems today was otherwise noncontributory   COVID 19 VACCINATION STATUS: Moderna x2 with the booster in November 2021   HISTORY OF CURRENT ILLNESS: From the original intake note:  Laurie Schmidt had routine screening mammography on 05/24/2020 at [facility] showing breast density category D.  Given that and the patient's family history she received a letter from [facility] stating she is at high risk for developing breast cancer and was referred here  The patient's subsequent history is as detailed below.   PAST MEDICAL HISTORY: Past Medical History:  Diagnosis Date  . Allergy   . COPD (chronic obstructive pulmonary disease) ([facility])    no meds    PAST SURGICAL HISTORY: Past Surgical History:  Procedure Laterality Date  . ABDOMINAL HYSTERECTOMY    . BREAST SURGERY    . DILATION AND CURETTAGE OF UTERUS    . I & D EXTREMITY Right 11/23/2019   Procedure: IRRIGATION AND DEBRIDEMENT EXTREMITY, right index finger;  Surgeon: Dominica Severin, MD;  [facility]: [facility];  Service: Orthopedics;  Laterality: Right;  . I & D EXTREMITY Right 11/25/2019   Procedure: REPEAT IRRIGATION AND DEBRIDEMENT RIGHT INDEX FINGER;  Surgeon: Dominica Severin, MD;  Location: [facility];  Service: Orthopedics;  Laterality: Right;  60 mins  . I & D EXTREMITY Right 02/05/2020   Procedure: IRRIGATION AND DEBRIDEMENT RIGHT INDEX FINGER AND BONE BIOPSY;  Surgeon: Dominica Severin, MD;  Location: [facility];  Service: Orthopedics;  Laterality: Right;  60 mins Block with IV Sed    FAMILY HISTORY: Family History  Problem Relation Age of Onset  . Cancer Father  melanoma  . Breast cancer Paternal Aunt   . Heart attack Maternal Grandmother   . Breast cancer Maternal Grandmother   . Heart attack Paternal Grandmother   . Heart attack Paternal Grandfather   . Healthy Mother   The patient's mother had atypical cells in her breast.  She underwent bilateral mastectomies.  Her mother, the patient's  maternal grandmother had breast cancer diagnosed around age 62.  She died at age 62 with metastatic disease.  The patient's maternal grandfather did not have cancer.  The patient's mother had 2 sisters, and 3 brothers.  1 sister had breast cancer at approximately age 875.  In addition there is a maternal cousin with cancer of a type unknown to the patient.  On the father side the patient's father had melanoma and a paternal aunt had breast cancer at age 62.  Apparently it spread to her omentum.  The patient herself has no brothers, 1 sister who is 2 years older than she and does not have a history of breast cancer   GYNECOLOGIC HISTORY:  No LMP recorded. Patient has had a hysterectomy. Menarche: 60 years old Age at first live birth: 60 years old GX P 1 LMP at the time of hysterectomy age 60 Contraceptive more than 10 years without complications HRT never used  Hysterectomy? yes BSO?  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 She lives at home with her mother who is currently 160 years old.  The patient's daughter Marcelino DusterMichelle lives in [place] where she manages clinical trials.  The patient has no grandchildren.  She is a Systems analystLutheran    ADVANCED DIRECTIVES:    HEALTH MAINTENANCE: Social History   Tobacco Use  . Smoking status: Former Smoker    Packs/day: 1.00    Years: 31.00    Pack years: 31.00    Types: Cigarettes    Quit date: 07/12/2014    Years since quitting: 6.1  . Smokeless tobacco: Never Used  Vaping Use  . Vaping Use: Never used  Substance Use Topics  . Alcohol use: Yes    Alcohol/week: 0.0 standard drinks    Comment: glass of wine or 2 with dinner  . Drug use: Never     Colonoscopy: 2016/Buccini  PAP: 09/2017, negative  Bone density: 06/2018, T score -1.5   No Known Allergies  Current Outpatient Medications  Medication Sig Dispense Refill  . Ascorbic Acid (VITAMIN C) 100 MG tablet Take 100 mg by mouth  daily.    Marland Kitchen. b complex vitamins tablet Take 1 tablet by mouth daily.    . Calcium Carb-Cholecalciferol (CALCIUM 500+D PO) Take 1 tablet by mouth daily.     . cholecalciferol (VITAMIN D) 1000 units tablet Take 1,000 Units by mouth daily.    . Coenzyme Q10 (COQ10) 100 MG CAPS Take 100 mg by mouth daily.     Marland Kitchen. ibuprofen (ADVIL,MOTRIN) 200 MG tablet Take 200 mg by mouth every 6 (six) hours as needed for moderate pain.     Marland Kitchen. loratadine (CLARITIN) 10 MG tablet Take 10 mg by mouth daily.    . Probiotic Product (PROBIOTIC DAILY PO) Take 1 capsule by mouth daily.    . Red Yeast Rice 600 MG CAPS Take 1 capsule by mouth daily.      No current facility-administered medications for this visit.    OBJECTIVE:   There were no vitals filed for this visit.   There is no height  or weight on file to calculate BMI.   Wt Readings from Last 3 Encounters:  07/28/20 155 lb 14.4 oz (70.7 kg)  03/10/20 159 lb (72.1 kg)  02/08/20 162 lb (73.5 kg)      ECOG FS:1 - Symptomatic but completely ambulatory  Telemedicine visit 09/05/2020  LAB RESULTS:  CMP     Component Value Date/Time   NA 140 01/20/2020 1556   K 4.6 01/20/2020 1556   CL 106 01/20/2020 1556   CO2 24 01/20/2020 1556   GLUCOSE 88 01/20/2020 1556   BUN 14 01/20/2020 1556   CREATININE 0.91 01/20/2020 1556   CALCIUM 9.0 01/20/2020 1556   GFRNONAA >60 11/27/2019 0614   GFRAA >60 11/27/2019 0614    No results found for: TOTALPROTELP, ALBUMINELP, A1GS, A2GS, BETS, BETA2SER, GAMS, MSPIKE, SPEI  Lab Results  Component Value Date   WBC 6.6 01/20/2020   HGB 14.0 01/20/2020   HCT 40.6 01/20/2020   MCV 101.2 (H) 01/20/2020   PLT 203 01/20/2020    No results found for: LABCA2  No components found for: [id number]  No results for input(s): INR in the last 168 hours.  No results found for: LABCA2  No results found for: EUM353  No results found for: IRW431  No results found for: VQM086  No results found for: CA2729  No components found  for: HGQUANT  No results found for: CEA1 / No results found for: CEA1   No results found for: AFPTUMOR  No results found for: CHROMOGRNA  No results found for: KPAFRELGTCHN, LAMBDASER, KAPLAMBRATIO (kappa/lambda light chains)  No results found for: HGBA, HGBA2QUANT, HGBFQUANT, HGBSQUAN (Hemoglobinopathy evaluation)   No results found for: LDH  No results found for: IRON, TIBC, IRONPCTSAT (Iron and TIBC)  No results found for: FERRITIN  Urinalysis No results found for: COLORURINE, APPEARANCEUR, LABSPEC, PHURINE, GLUCOSEU, HGBUR, BILIRUBINUR, KETONESUR, PROTEINUR, UROBILINOGEN, NITRITE, LEUKOCYTESUR   STUDIES: No results found.     ELIGIBLE FOR AVAILABLE RESEARCH PROTOCOL: aet  ASSESSMENT: 60 y.o. Laurie Schmidt, [place] woman with category D breast density and a strong family history, breast cancer risk approaching 25% according to the [employer] model  (1) intensified screening:  (a) mammography at [facility] every November  (b) breast MRI every May at [facility]  (2) risk reduction: Tamoxifen started 07/28/2020  (a) s/p hysterectomy  (b) bone density DEC 2019 shows osteopenia (T score - 1.5)   PLAN: Adell is tolerating tamoxifen without any significant side effects and she feels it will not be a major issue for her to take this for 5 years.  Even though her hot flashes are not worse than before, her hot flashes were significant before and particularly did wake her up.  Today we talked about gabapentin to be taken at bedtime.  She has a good understanding of the possible toxicities side effects and complications of this agent as well as the possible benefits.  She is interested in giving it to try and I have put the order in for her.  She is at high risk for breast cancer and I have entered an order for her to have a breast MRI in May.  I did ask her to make sure she asks what the out-of-pocket cost will be and if it seems excessive she will let me know.  Otherwise she  will see me in person shortly after the breast MRI.  She knows to call for any other issue that may develop before the next visit   Raymond Gurney C. Luismanuel Corman,  MD 09/05/2020 2:04 PM Medical Oncology and Hematology [facility] [address] [place], [place] [address] Tel. [phone]    Fax. [phone]   This document serves as a record of services personally performed by Ruthann Cancer, MD. It was created on his behalf by Mickie Bail, a trained medical scribe. The creation of this record is based on the scribe's personal observations and the provider's statements to them.   I, Ruthann Cancer MD, have reviewed the above documentation for accuracy and completeness, and I agree with the above.   *Total Encounter Time as defined by the Centers for Medicare and Medicaid Services includes, in addition to the face-to-face time of a patient visit (documented in the note above) non-face-to-face time: obtaining and reviewing outside history, ordering and reviewing medications, tests or procedures, care coordination (communications with other health care professionals or caregivers) and documentation in the medical record.

## 2020-09-05 ENCOUNTER — Telehealth (HOSPITAL_BASED_OUTPATIENT_CLINIC_OR_DEPARTMENT_OTHER): Payer: Commercial Managed Care - PPO | Admitting: Oncology

## 2020-09-05 DIAGNOSIS — Z1239 Encounter for other screening for malignant neoplasm of breast: Secondary | ICD-10-CM | POA: Diagnosis not present

## 2020-09-08 ENCOUNTER — Encounter: Payer: Self-pay | Admitting: Oncology

## 2020-09-12 ENCOUNTER — Telehealth: Payer: Self-pay

## 2020-09-12 ENCOUNTER — Other Ambulatory Visit: Payer: Self-pay

## 2020-09-12 MED ORDER — GABAPENTIN 300 MG PO CAPS: 300.0000 mg | ORAL_CAPSULE | Freq: Every day | ORAL | 4 refills | Status: DC

## 2020-09-12 NOTE — Telephone Encounter (Signed)
Left message for pt that her gabapentin prescription had been sent in.

## 2020-10-16 ENCOUNTER — Encounter: Payer: Self-pay | Admitting: Oncology

## 2020-10-18 ENCOUNTER — Other Ambulatory Visit: Payer: Self-pay | Admitting: Oncology

## 2020-10-18 ENCOUNTER — Other Ambulatory Visit: Payer: Self-pay

## 2020-10-18 MED ORDER — GABAPENTIN 100 MG PO CAPS: 100.0000 mg | ORAL_CAPSULE | Freq: Every day | ORAL | 3 refills | Status: DC

## 2020-10-19 ENCOUNTER — Other Ambulatory Visit: Payer: Self-pay | Admitting: Oncology

## 2020-10-28 ENCOUNTER — Ambulatory Visit: Payer: Commercial Managed Care - PPO | Admitting: Obstetrics & Gynecology

## 2020-10-31 ENCOUNTER — Ambulatory Visit: Payer: Commercial Managed Care - PPO | Admitting: Obstetrics & Gynecology

## 2020-11-01 ENCOUNTER — Ambulatory Visit: Payer: Commercial Managed Care - PPO | Admitting: Obstetrics & Gynecology

## 2020-11-04 ENCOUNTER — Encounter: Payer: Self-pay | Admitting: Obstetrics & Gynecology

## 2020-11-04 ENCOUNTER — Other Ambulatory Visit (HOSPITAL_COMMUNITY)
Admission: RE | Admit: 2020-11-04 | Discharge: 2020-11-04 | Disposition: A | Discharge status: Home / Self Care | Payer: Commercial Managed Care - PPO | Source: Ambulatory Visit | Attending: Obstetrics & Gynecology | Admitting: Obstetrics & Gynecology

## 2020-11-04 ENCOUNTER — Ambulatory Visit (INDEPENDENT_AMBULATORY_CARE_PROVIDER_SITE_OTHER): Payer: Commercial Managed Care - PPO | Admitting: Obstetrics & Gynecology

## 2020-11-04 ENCOUNTER — Other Ambulatory Visit: Payer: Self-pay

## 2020-11-04 VITALS — BP 122/78 | Ht 70.0 in | Wt 152.0 lb

## 2020-11-04 DIAGNOSIS — Z1272 Encounter for screening for malignant neoplasm of vagina: Secondary | ICD-10-CM | POA: Diagnosis not present

## 2020-11-04 DIAGNOSIS — M8588 Other specified disorders of bone density and structure, other site: Secondary | ICD-10-CM | POA: Diagnosis not present

## 2020-11-04 DIAGNOSIS — Z01419 Encounter for gynecological examination (general) (routine) without abnormal findings: Secondary | ICD-10-CM

## 2020-11-04 DIAGNOSIS — Z780 Asymptomatic menopausal state: Secondary | ICD-10-CM | POA: Diagnosis not present

## 2020-11-04 NOTE — Progress Notes (Signed)
Laurie Schmidt 1961-04-03 [id number]   History:    60 y.o. G3P1A2L1 Divorced. Mother lives with her, severe Osteoporosis with Lumbar fracture.  [id number] presenting for annual gyn exam   SWF:UXNATF post LAVH. Postmenopause, well on no hormone replacement therapy. No pelvic pain. Normal vaginal secretions. Abstinent. Urine and bowel movements normal. Breasts normal, s/p Bilateral saline implants x 23 yrs, would like to have them removed. High risk of Breast Ca.  Seen by Dr Darnelle Catalan and started on Tamoxifen for prophylaxis.  Will do a Breast MRI for better screening.  Body mass index 21.81.Enjoys walking regularly. Taking vitamin D and calcium supplements. Bone density 06/2018 Osteopenia Spine -1.5, will repeat now. Colonoscopy in 2019, scheduled in 01/2021. Health labs with family physician.  Past medical history,surgical history, family history and social history were all reviewed and documented in the EPIC chart.  Gynecologic History No LMP recorded. Patient has had a hysterectomy.  Obstetric History OB History  Gravida Para Term Preterm AB Living  3 1     2 1   SAB IAB Ectopic Multiple Live Births               # Outcome Date GA Lbr Len/2nd Weight Sex Delivery Anes PTL Lv  3 AB           2 AB           1 Para              ROS: A ROS was performed and pertinent positives and negatives are included in the history.  GENERAL: No fevers or chills. HEENT: No change in vision, no earache, sore throat or sinus congestion. NECK: No pain or stiffness. CARDIOVASCULAR: No chest pain or pressure. No palpitations. PULMONARY: No shortness of breath, cough or wheeze. GASTROINTESTINAL: No abdominal pain, nausea, vomiting or diarrhea, melena or bright red blood per rectum. GENITOURINARY: No urinary frequency, urgency, hesitancy or dysuria. MUSCULOSKELETAL: No joint or muscle pain, no back pain, no recent trauma. DERMATOLOGIC: No rash, no itching, no lesions. ENDOCRINE:  No polyuria, polydipsia, no heat or cold intolerance. No recent change in weight. HEMATOLOGICAL: No anemia or easy bruising or bleeding. NEUROLOGIC: No headache, seizures, numbness, tingling or weakness. PSYCHIATRIC: No depression, no loss of interest in normal activity or change in sleep pattern.     Exam:   BP 122/78   Ht 5\' 10"  (1.778 m)   Wt 152 lb (68.9 kg)   BMI 21.81 kg/m   Body mass index is 21.81 kg/m.  General appearance : Well developed well nourished female. No acute distress HEENT: Eyes: no retinal hemorrhage or exudates,  Neck supple, trachea midline, no carotid bruits, no thyroidmegaly Lungs: Clear to auscultation, no rhonchi or wheezes, or rib retractions  Heart: Regular rate and rhythm, no murmurs or gallops Breast:Examined in sitting and supine position were symmetrical in appearance, no palpable masses or tenderness,  no skin retraction, no nipple inversion, no nipple discharge, no skin discoloration, no axillary or supraclavicular lymphadenopathy Abdomen: no palpable masses or tenderness, no rebound or guarding Extremities: no edema or skin discoloration or tenderness  Pelvic: Vulva: Normal             Vagina: No gross lesions or discharge.  Pap reflex done.  Cervix/Uterus absent  Adnexa  Without masses or tenderness  Anus: Normal   Assessment/Plan:  60 y.o. female for annual exam   1. Encounter for Papanicolaou smear of vagina as part of routine gynecological examination Gynecologic exam status  post total hysterectomy.  Sexually active with a new partner.  Pap reflex done at the vaginal vault.  Breast exam status bilateral implants.  High risk of breast cancer due to family history and mammographic findings.  Seen by Dr. Darnelle Catalan.  Started on tamoxifen for prophylaxis.  Colonoscopy scheduled in July 2022.  Health labs with family physician.  Good body mass index at 21.81.  Continue with fitness and healthy nutrition.  2. Postmenopausal Well on no hormone  replacement therapy.  3. Osteopenia of lumbar spine History of osteopenia, schedule bone density.  Vitamin D supplements, calcium intake of 1.5 g/day total and regular weightbearing physical activities. - DG Bone Density; Future  Other orders - tamoxifen (NOLVADEX) 20 MG tablet; Take 20 mg by mouth daily.  Genia Del MD, 3:25 PM 11/04/2020

## 2020-11-05 ENCOUNTER — Encounter: Payer: Self-pay | Admitting: Obstetrics & Gynecology

## 2020-11-10 LAB — CYTOLOGY - PAP
Comment: NEGATIVE
Diagnosis: UNDETERMINED — AB
High risk HPV: NEGATIVE

## 2020-11-15 ENCOUNTER — Other Ambulatory Visit: Payer: Self-pay

## 2020-11-15 ENCOUNTER — Ambulatory Visit
Admission: RE | Admit: 2020-11-15 | Discharge: 2020-11-15 | Disposition: A | Discharge status: Home / Self Care | Payer: Commercial Managed Care - PPO | Source: Ambulatory Visit | Attending: Oncology | Admitting: Oncology

## 2020-11-15 DIAGNOSIS — Z1239 Encounter for other screening for malignant neoplasm of breast: Secondary | ICD-10-CM

## 2020-11-15 DIAGNOSIS — Z9189 Other specified personal risk factors, not elsewhere classified: Secondary | ICD-10-CM

## 2020-11-15 MED ORDER — GADOBUTROL 1 MMOL/ML IV SOLN
7.0000 mL | Freq: Once | INTRAVENOUS | Status: AC | PRN
  Administered 2020-11-15: 7 mL via INTRAVENOUS

## 2020-12-07 ENCOUNTER — Encounter: Payer: Self-pay | Admitting: Oncology

## 2021-01-30 ENCOUNTER — Ambulatory Visit: Payer: Commercial Managed Care - PPO | Admitting: Obstetrics & Gynecology

## 2021-01-30 ENCOUNTER — Encounter: Payer: Self-pay | Admitting: Obstetrics & Gynecology

## 2021-01-30 ENCOUNTER — Other Ambulatory Visit: Payer: Self-pay

## 2021-01-30 VITALS — BP 148/90

## 2021-01-30 DIAGNOSIS — R1032 Left lower quadrant pain: Secondary | ICD-10-CM | POA: Diagnosis not present

## 2021-01-30 NOTE — Progress Notes (Signed)
    Laurie Schmidt [place] 05/31/1961 [id number]        60 y.o.  [id number]   RP: Left inguinal LN enlargement  HPI: S/P LAVH/Preservation of Ovaries.  Left inguinal LN enlargement.  Today feels a little smaller.  Tender to touch.  No Pelvic pain.  No vaginal d/c.  Abstinent.  Urine/BMs normal.  No fever.   OB History  Gravida Para Term Preterm AB Living  3 1     2 1   SAB IAB Ectopic Multiple Live Births               # Outcome Date GA Lbr Len/2nd Weight Sex Delivery Anes PTL Lv  3 AB           2 AB           1 Para             Past medical history,surgical history, problem list, medications, allergies, family history and social history were all reviewed and documented in the EPIC chart.   Directed ROS with pertinent positives and negatives documented in the history of present illness/assessment and plan.  Exam:  Vitals:   01/30/21 1552  BP: (!) 148/90   General appearance:  Normal  Abdomen: Soft, NT, not distended.                     Rt inguinal area normal                   Lt inguinal area thickened, feels like a cylinder.  Mobile, mildly tender.  Gynecologic exam: Deferred   Assessment/Plan:  60 y.o. 60   1. Left inguinal pain/Lymph node Mildly enlarged and tender inguinal area.  Inflammation?  LN?  No evidence of hernia.  Will verify with Radiology to evaluate by CT scan vs [id number].  F/U with me for Pelvic US to evaluate the ovaries. - US Transvaginal Non-OB; Future  Other orders - fluticasone (FLONASE) 50 MCG/ACT nasal spray; Place 1 spray into both nostrils daily.   Other orders - fluticasone (FLONASE) 50 MCG/ACT nasal spray; Place 1 spray into both nostrils daily.   [place] MD, 4:13 PM 01/30/2021

## 2021-01-31 ENCOUNTER — Telehealth: Payer: Self-pay | Admitting: *Deleted

## 2021-01-31 DIAGNOSIS — R1032 Left lower quadrant pain: Secondary | ICD-10-CM

## 2021-01-31 NOTE — Telephone Encounter (Signed)
Order placed at [facility], left message for patient to call to provider her with number to call and schedule.

## 2021-01-31 NOTE — Telephone Encounter (Signed)
-----   Message from Genia Del, MD sent at 01/30/2021  4:18 PM EDT ----- Regarding: Left inguinal enlargement/tenderness Please verify with Radiology what radiologic imaging they would favor to look at the left inguinal area.  Thickened inguinal ligament, enlarged Lymph Node?

## 2021-01-31 NOTE — Telephone Encounter (Signed)
Patient scheduled on 02/10/21 @ 1:00pm

## 2021-02-05 ENCOUNTER — Encounter: Payer: Self-pay | Admitting: Obstetrics & Gynecology

## 2021-02-10 ENCOUNTER — Ambulatory Visit
Admission: RE | Admit: 2021-02-10 | Discharge: 2021-02-10 | Disposition: A | Discharge status: Home / Self Care | Payer: Commercial Managed Care - PPO | Source: Ambulatory Visit | Attending: Obstetrics & Gynecology | Admitting: Obstetrics & Gynecology

## 2021-02-10 DIAGNOSIS — R1032 Left lower quadrant pain: Secondary | ICD-10-CM

## 2021-02-16 ENCOUNTER — Encounter: Payer: Self-pay | Admitting: Obstetrics & Gynecology

## 2021-02-16 ENCOUNTER — Other Ambulatory Visit: Payer: Self-pay

## 2021-02-16 ENCOUNTER — Ambulatory Visit: Payer: Commercial Managed Care - PPO | Admitting: Obstetrics & Gynecology

## 2021-02-16 VITALS — BP 122/80

## 2021-02-16 DIAGNOSIS — R1032 Left lower quadrant pain: Secondary | ICD-10-CM

## 2021-02-16 NOTE — Progress Notes (Signed)
    Laurie Schmidt 1961-04-23 [id number]        60 y.o.  [id number]   RP: Enlarged Left Inguinal area  HPI: Still enlarged left inguinal area.  Mildly tender.  No vulvar lesion.  No PMB.  No pelvic pain.   OB History  Gravida Para Term Preterm AB Living  3 1     2 1   SAB IAB Ectopic Multiple Live Births               # Outcome Date GA Lbr Len/2nd Weight Sex Delivery Anes PTL Lv  3 AB           2 AB           1 Para             Past medical history,surgical history, problem list, medications, allergies, family history and social history were all reviewed and documented in the EPIC chart.   Directed ROS with pertinent positives and negatives documented in the history of present illness/assessment and plan.  Exam:  Vitals:   02/16/21 1445  BP: 122/80   General appearance:  Normal  Abdomen: Left inguinal area with a mild cylindrical enlargement.  Mild tenderness.  Gynecologic exam: Deferred  04/18/21 of Pelvic/Inguinal areas 02/10/2021:  No suspicious abnormality in the LEFT groin, in the area of concern. Small lymph nodes do not reach criteria for pathologic enlargement. No hernia identified.   Assessment/Plan:  60 y.o. 60   1. Left inguinal pain  Left inguinal area thickened and tender.  [id number] LN wnl, no evidence of hernia.  Decision to refer to General surgery for opinion and management.  [place] MD, 3:12 PM 02/16/2021

## 2021-02-17 ENCOUNTER — Telehealth: Payer: Self-pay | Admitting: *Deleted

## 2021-02-17 NOTE — Telephone Encounter (Signed)
-----   Message from Genia Del, MD sent at 02/16/2021  3:10 PM EDT ----- Regarding: Refer to General Surgery Left inguinal area thickened and tender.  US showing LN wnl and no evidence of hernia.  Opinion and management.

## 2021-02-17 NOTE — Telephone Encounter (Signed)
Staff message sent to [facility] referral coordinator to call and schedule and let me know time/date.

## 2021-02-19 ENCOUNTER — Encounter: Payer: Self-pay | Admitting: Obstetrics & Gynecology

## 2021-02-22 NOTE — Telephone Encounter (Signed)
Patient scheduled on 03/07/21 @ 4:10 with Dr Bedelia Person

## 2021-03-01 ENCOUNTER — Other Ambulatory Visit: Payer: Self-pay

## 2021-03-01 ENCOUNTER — Ambulatory Visit (INDEPENDENT_AMBULATORY_CARE_PROVIDER_SITE_OTHER): Payer: Commercial Managed Care - PPO | Admitting: Otolaryngology

## 2021-03-01 DIAGNOSIS — S00412A Abrasion of left ear, initial encounter: Secondary | ICD-10-CM

## 2021-03-01 NOTE — Progress Notes (Signed)
HPI: Laurie Schmidt is a 60 y.o. female who presents is referred by her PCP for evaluation of bleeding from the left ear canal.  This occurred about a week and a half ago.  She denies any trauma to the ear or using any Q-tips in the ear.  She has had no bleeding over the past 5 or 6 days.  She saw her PCP who noted a scab or abnormality in the ear canal and was recommended follow-up here..  Past Medical History:  Diagnosis Date   Allergy    COPD (chronic obstructive pulmonary disease) (HCC)    no meds   Past Surgical History:  Procedure Laterality Date   ABDOMINAL HYSTERECTOMY     BREAST SURGERY     DILATION AND CURETTAGE OF UTERUS     I & D EXTREMITY Right 11/23/2019   Procedure: IRRIGATION AND DEBRIDEMENT EXTREMITY, right index finger;  Surgeon: Dominica Severin, MD;  Location: [facility] OR;  Service: Orthopedics;  Laterality: Right;   I & D EXTREMITY Right 11/25/2019   Procedure: REPEAT IRRIGATION AND DEBRIDEMENT RIGHT INDEX FINGER;  Surgeon: Dominica Severin, MD;  Location: [facility] OR;  Service: Orthopedics;  Laterality: Right;  60 mins   I & D EXTREMITY Right 02/05/2020   Procedure: IRRIGATION AND DEBRIDEMENT RIGHT INDEX FINGER AND BONE BIOPSY;  Surgeon: Dominica Severin, MD;  Location: [facility];  Service: Orthopedics;  Laterality: Right;  60 mins Block with IV Sed   Social History   Socioeconomic History   Marital status: Single    Spouse name: Not on file   Number of children: Not on file   Years of education: Not on file   Highest education level: Not on file  Occupational History   Not on file  Tobacco Use   Smoking status: Former    Packs/day: 1.00    Years: 31.00    Pack years: 31.00    Types: Cigarettes    Quit date: 07/12/2014    Years since quitting: 6.6   Smokeless tobacco: Never  Vaping Use   Vaping Use: Never used  Substance and Sexual Activity   Alcohol use: Not Currently    Comment: glass of wine or 2 with dinner   Drug use: Never   Sexual activity:  Yes    Partners: Male    Comment: 1st intercourse-  70,   Other Topics Concern   Not on file  Social History Narrative   Not on file   Social Determinants of Health   Financial Resource Strain: Not on file  Food Insecurity: Not on file  Transportation Needs: Not on file  Physical Activity: Not on file  Stress: Not on file  Social Connections: Not on file   Family History  Problem Relation Age of Onset   Cancer Father        melanoma   Breast cancer Paternal Aunt    Heart attack Maternal Grandmother    Breast cancer Maternal Grandmother    Heart attack Paternal Grandmother    Heart attack Paternal Grandfather    Healthy Mother    No Known Allergies Prior to Admission medications   Medication Sig Start Date End Date Taking? Authorizing Provider  Ascorbic Acid (VITAMIN C) 100 MG tablet Take 100 mg by mouth daily.    [provider]  b complex vitamins tablet Take 1 tablet by mouth daily.    [provider]  Calcium Carb-Cholecalciferol (CALCIUM 500+D PO) Take 1 tablet by mouth daily.  [provider]  cholecalciferol (VITAMIN D) 1000 units tablet Take 1,000 Units by mouth daily.    [provider]  Coenzyme Q10 (COQ10) 100 MG CAPS Take 100 mg by mouth daily.     [provider]  fluticasone (FLONASE) 50 MCG/ACT nasal spray Place 1 spray into both nostrils daily. 10/25/20   [provider]  ibuprofen (ADVIL,MOTRIN) 200 MG tablet Take 200 mg by mouth every 6 (six) hours as needed for moderate pain.     [provider]  loratadine (CLARITIN) 10 MG tablet Take 10 mg by mouth daily.    [provider]  Probiotic Product (PROBIOTIC DAILY PO) Take 1 capsule by mouth daily.    [provider]  Red Yeast Rice 600 MG CAPS Take 1 capsule by mouth daily.     [provider]  tamoxifen (NOLVADEX) 20 MG tablet Take 20 mg by mouth daily.    [provider]     Positive ROS: Otherwise  negative  All other systems have been reviewed and were otherwise negative with the exception of those mentioned in the HPI and as above.  Physical Exam: Constitutional: Alert, well-appearing, no acute distress Ears: External ears without lesions or tenderness.  On microscopic exam the right ear canal and right TM are clear.  On examination of the left ear canal she has a small scab inferiorly in the left ear canal at the beginning of the bony ear canal.  This appears to be healing nicely it is nonobstructing and has not bled recently.  There is no inflammatory changes or signs of infection.  The TM is clear and normal to exam. Nasal: External nose without lesions.. Clear nasal passages Oral: Lips and gums without lesions. Tongue and palate mucosa without lesions. Posterior oropharynx clear. Neck: No palpable adenopathy or masses Respiratory: Breathing comfortably  Skin: No facial/neck lesions or rash noted.  Procedures  Assessment: Left ear canal abrasion questionable etiology.  Plan: There is no signs of infection and this should heal and do well.  Cautioned her about using anything in the ear canal that may disturb this.  This should heal and be fine with no further problems. She will follow-up if she has any further bleeding.   Narda Bonds, MD   CC:

## 2021-03-22 ENCOUNTER — Other Ambulatory Visit: Payer: Self-pay | Admitting: Surgery

## 2021-03-22 DIAGNOSIS — R1032 Left lower quadrant pain: Secondary | ICD-10-CM

## 2021-04-10 ENCOUNTER — Other Ambulatory Visit: Payer: Commercial Managed Care - PPO

## 2021-04-13 ENCOUNTER — Ambulatory Visit (INDEPENDENT_AMBULATORY_CARE_PROVIDER_SITE_OTHER): Payer: Commercial Managed Care - PPO

## 2021-04-13 ENCOUNTER — Other Ambulatory Visit: Payer: Self-pay

## 2021-04-13 ENCOUNTER — Other Ambulatory Visit: Payer: Self-pay | Admitting: Obstetrics & Gynecology

## 2021-04-13 DIAGNOSIS — Z780 Asymptomatic menopausal state: Secondary | ICD-10-CM

## 2021-04-13 DIAGNOSIS — M8588 Other specified disorders of bone density and structure, other site: Secondary | ICD-10-CM

## 2021-04-18 ENCOUNTER — Encounter: Payer: Self-pay | Admitting: *Deleted

## 2021-04-20 ENCOUNTER — Ambulatory Visit
Admission: RE | Admit: 2021-04-20 | Discharge: 2021-04-20 | Disposition: A | Discharge status: Home / Self Care | Payer: Commercial Managed Care - PPO | Source: Ambulatory Visit | Attending: Surgery | Admitting: Surgery

## 2021-04-20 DIAGNOSIS — R1032 Left lower quadrant pain: Secondary | ICD-10-CM

## 2021-04-20 MED ORDER — IOPAMIDOL (ISOVUE-300) INJECTION 61%
100.0000 mL | Freq: Once | INTRAVENOUS | Status: AC | PRN
  Administered 2021-04-20: 100 mL via INTRAVENOUS

## 2021-05-15 ENCOUNTER — Other Ambulatory Visit: Payer: Self-pay

## 2021-05-15 ENCOUNTER — Ambulatory Visit (INDEPENDENT_AMBULATORY_CARE_PROVIDER_SITE_OTHER)
Admission: RE | Admit: 2021-05-15 | Discharge: 2021-05-15 | Disposition: A | Discharge status: Home / Self Care | Payer: Commercial Managed Care - PPO | Source: Ambulatory Visit | Attending: Cardiovascular Disease | Admitting: Cardiovascular Disease

## 2021-05-15 DIAGNOSIS — Z87891 Personal history of nicotine dependence: Secondary | ICD-10-CM

## 2021-05-17 ENCOUNTER — Other Ambulatory Visit: Payer: Self-pay | Admitting: Acute Care

## 2021-05-17 DIAGNOSIS — Z87891 Personal history of nicotine dependence: Secondary | ICD-10-CM

## 2021-06-05 ENCOUNTER — Encounter: Payer: Self-pay | Admitting: Obstetrics & Gynecology

## 2021-08-03 ENCOUNTER — Encounter: Payer: Self-pay | Admitting: Obstetrics & Gynecology

## 2021-10-29 HISTORY — PX: BLEPHAROPLASTY: SUR158

## 2021-11-03 HISTORY — PX: BREAST IMPLANT REMOVAL: SUR1101

## 2021-12-01 ENCOUNTER — Encounter: Payer: Self-pay | Admitting: Obstetrics & Gynecology

## 2021-12-01 ENCOUNTER — Other Ambulatory Visit (HOSPITAL_COMMUNITY)
Admission: RE | Admit: 2021-12-01 | Discharge: 2021-12-01 | Disposition: A | Discharge status: Home / Self Care | Payer: Commercial Managed Care - PPO | Source: Ambulatory Visit | Attending: Obstetrics & Gynecology | Admitting: Obstetrics & Gynecology

## 2021-12-01 ENCOUNTER — Ambulatory Visit (INDEPENDENT_AMBULATORY_CARE_PROVIDER_SITE_OTHER): Payer: Commercial Managed Care - PPO | Admitting: Obstetrics & Gynecology

## 2021-12-01 VITALS — BP 118/82 | HR 72 | Resp 16 | Ht 69.75 in | Wt 157.0 lb

## 2021-12-01 DIAGNOSIS — R87620 Atypical squamous cells of undetermined significance on cytologic smear of vagina (ASC-US): Secondary | ICD-10-CM | POA: Diagnosis not present

## 2021-12-01 DIAGNOSIS — Z9189 Other specified personal risk factors, not elsewhere classified: Secondary | ICD-10-CM

## 2021-12-01 DIAGNOSIS — Z1272 Encounter for screening for malignant neoplasm of vagina: Secondary | ICD-10-CM

## 2021-12-01 DIAGNOSIS — M8588 Other specified disorders of bone density and structure, other site: Secondary | ICD-10-CM | POA: Diagnosis not present

## 2021-12-01 DIAGNOSIS — Z01419 Encounter for gynecological examination (general) (routine) without abnormal findings: Secondary | ICD-10-CM

## 2021-12-01 DIAGNOSIS — Z780 Asymptomatic menopausal state: Secondary | ICD-10-CM | POA: Diagnosis not present

## 2021-12-01 NOTE — Progress Notes (Signed)
Laurie Schmidt 11-10-1960 [id number]   History:    61 y.o. G3P1A2L1 Divorced.  Mother lives with her, severe Osteoporosis with Lumbar fracture/My patient as well.   RP:  Established patient presenting for annual gyn exam    HPI: Status post LAVH.  Postmenopause, well on no hormone replacement therapy.  No pelvic pain.  Normal vaginal secretions.  Sexually active.  C/o superficial dyspareunia.  Pap ASCUS in 10/2020.  Pap reflex today. Urine and bowel movements normal.  Breasts normal, s/p Bilateral saline implants removed in 10/2021.  High risk of Breast Ca.  Seen by Dr Darnelle Catalan.  Stopped Tamoxifen for prophylaxis.  Breast MRI for better screening.  Mammo 05/2021 Neg. Body mass index 22.69. Enjoys walking regularly.  Taking vitamin D and calcium supplements.  Bone density 04/2021 Osteopenia Spine -1.4.  Colonoscopy in 2019.  Health labs with family physician.    Past medical history,surgical history, family history and social history were all reviewed and documented in the EPIC chart.  Gynecologic History No LMP recorded. Patient has had a hysterectomy.   Obstetric History OB History  Gravida Para Term Preterm AB Living  3 1 1   2 1   SAB IAB Ectopic Multiple Live Births    2          # Outcome Date GA Lbr Len/2nd Weight Sex Delivery Anes PTL Lv  3 IAB           2 IAB           1 Term              ROS: A ROS was performed and pertinent positives and negatives are included in the history.  GENERAL: No fevers or chills. HEENT: No change in vision, no earache, sore throat or sinus congestion. NECK: No pain or stiffness. CARDIOVASCULAR: No chest pain or pressure. No palpitations. PULMONARY: No shortness of breath, cough or wheeze. GASTROINTESTINAL: No abdominal pain, nausea, vomiting or diarrhea, melena or bright red blood per rectum. GENITOURINARY: No urinary frequency, urgency, hesitancy or dysuria. MUSCULOSKELETAL: No joint or muscle pain, no back pain, no recent trauma. DERMATOLOGIC:  No rash, no itching, no lesions. ENDOCRINE: No polyuria, polydipsia, no heat or cold intolerance. No recent change in weight. HEMATOLOGICAL: No anemia or easy bruising or bleeding. NEUROLOGIC: No headache, seizures, numbness, tingling or weakness. PSYCHIATRIC: No depression, no loss of interest in normal activity or change in sleep pattern.     Exam:   BP 118/82   Pulse 72   Resp 16   Ht 5' 9.75" (1.772 m)   Wt 157 lb (71.2 kg)   BMI 22.69 kg/m   Body mass index is 22.69 kg/m.  General appearance : Well developed well nourished female. No acute distress HEENT: Eyes: no retinal hemorrhage or exudates,  Neck supple, trachea midline, no carotid bruits, no thyroidmegaly Lungs: Clear to auscultation, no rhonchi or wheezes, or rib retractions  Heart: Regular rate and rhythm, no murmurs or gallops Breast:Examined in sitting and supine position were symmetrical in appearance, no palpable masses or tenderness,  no skin retraction, no nipple inversion, no nipple discharge, no skin discoloration, no axillary or supraclavicular lymphadenopathy.  Healing well from removal of implants. Abdomen: no palpable masses or tenderness, no rebound or guarding Extremities: no edema or skin discoloration or tenderness  Pelvic: Vulva: Normal             Vagina: No gross lesions or discharge.  Pap reflex done.  Cervix/Uterus absent  Adnexa  Without masses or tenderness  Anus: Normal   Assessment/Plan:  61 y.o. female for annual exam   1. Encounter for Papanicolaou smear of vagina as part of routine gynecological examination Status post LAVH.  Postmenopause, well on no hormone replacement therapy.  No pelvic pain.  Normal vaginal secretions.  Sexually active.  C/o superficial dyspareunia.  Pap ASCUS in 10/2020.  Pap reflex today. Urine and bowel movements normal.  Breasts normal, s/p Bilateral saline implants removed in 10/2021.  High risk of Breast Ca.  Seen by Dr Darnelle Catalan.  Stopped Tamoxifen for prophylaxis.   Breast MRI for better screening.  Mammo 05/2021 Neg. Body mass index 22.69. Enjoys walking regularly.  Taking vitamin D and calcium supplements.  Bone density 04/2021 Osteopenia Spine -1.4.  Colonoscopy in 2019.  Health labs with family physician. - Cytology - PAP( [place])  2. Atypical squamous cell changes of undetermined significance (ASCUS) on vaginal cytology -Pap reflex  3. Postmenopausal Status post LAVH.  Postmenopause, well on no hormone replacement therapy.  No pelvic pain.  Normal vaginal secretions.  Sexually active. C/o superficial dyspareunia.  Counseling done on dyspareunia in menopause.  Will try Coconut oil and changes in position.  4. Osteopenia of lumbar spine Osteopenia T-Score -1.4 on Bone Density 04/2021.  Repeat at 2 yrs.  Vit D, Ca++, weight bearing activities.  5. At high risk for breast cancer Mammo Neg 05/2021.  Other orders - VITAMIN D PO; Take 5,000 Int'l Units by mouth daily.   Genia Del MD, 1:50 PM 12/01/2021

## 2021-12-14 LAB — CYTOLOGY - PAP: Diagnosis: NEGATIVE

## 2021-12-16 IMAGING — US US PELVIS LIMITED
1 series · 14 of 23 positions shown · non-contrast
Comparison: CT of the pelvis 04/09/2018

CLINICAL DATA: LEFT bulging inguinal mass for 8 weeks. Area is
painful. No known injury.

EXAM:
LIMITED ULTRASOUND OF PELVIS
TECHNIQUE: Limited transabdominal ultrasound examination of the pelvis was
performed.

[Series 1: us pelvis limited · 0.08mm/px · 23 acquisitions, 14 frames shown]
[im 1/23]
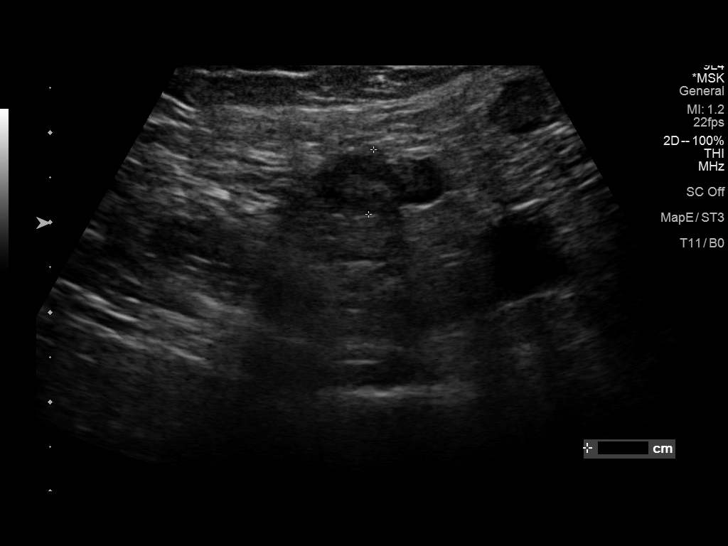
[im 3/23]
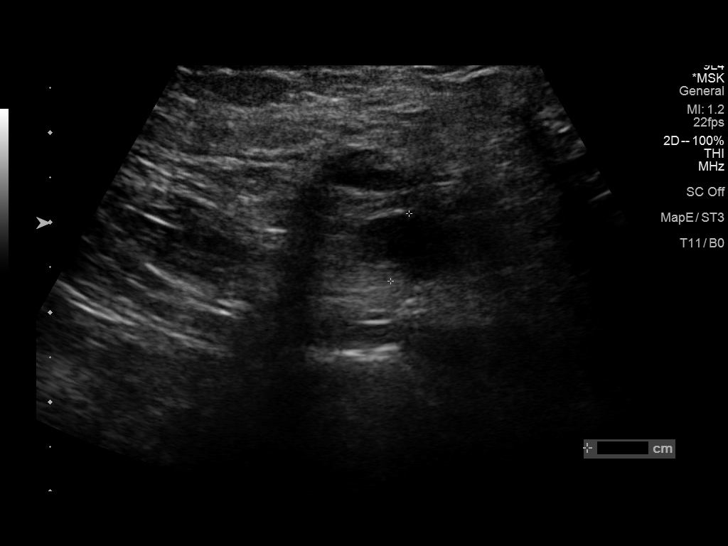
[im 5/23]
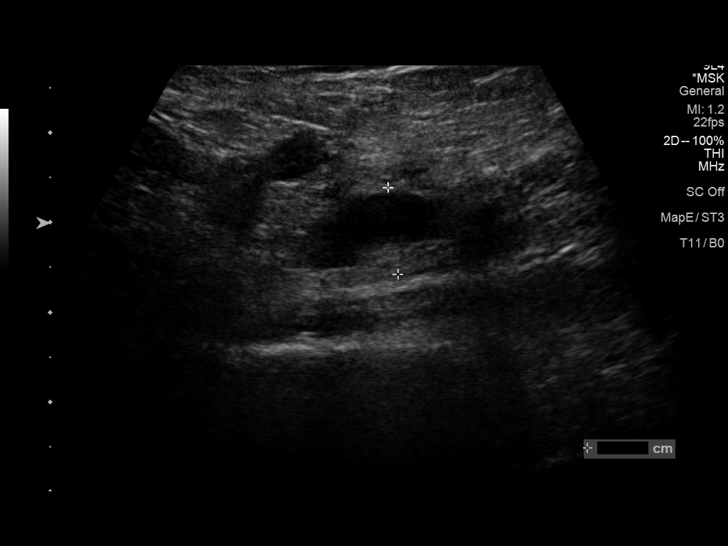
[im 6/23]
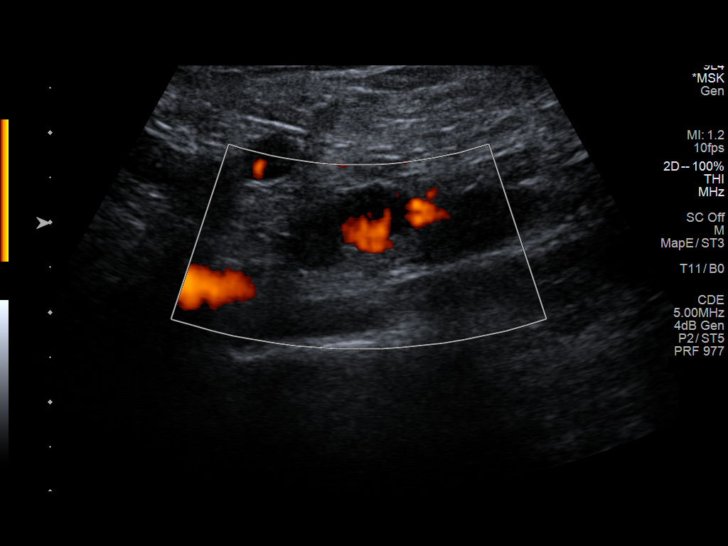
[im 8/23]
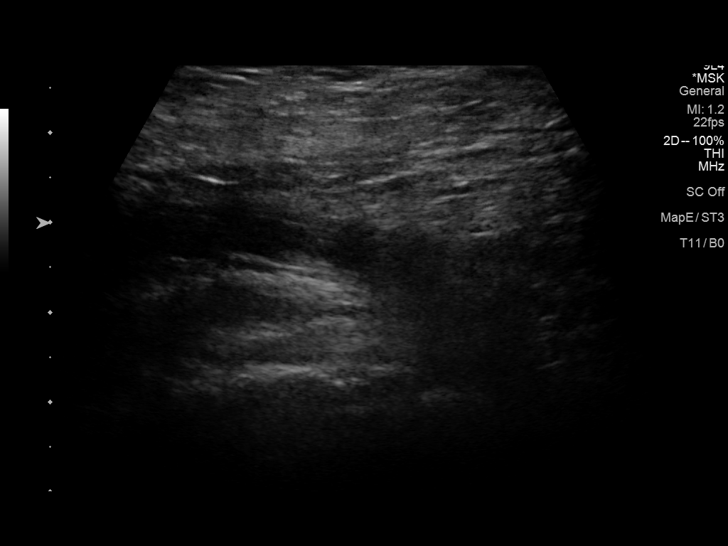
[im 10/23]
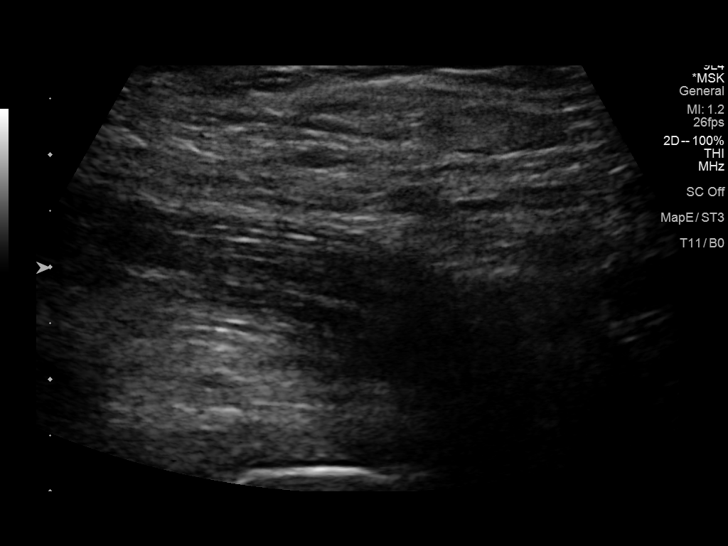
[im 11/23]
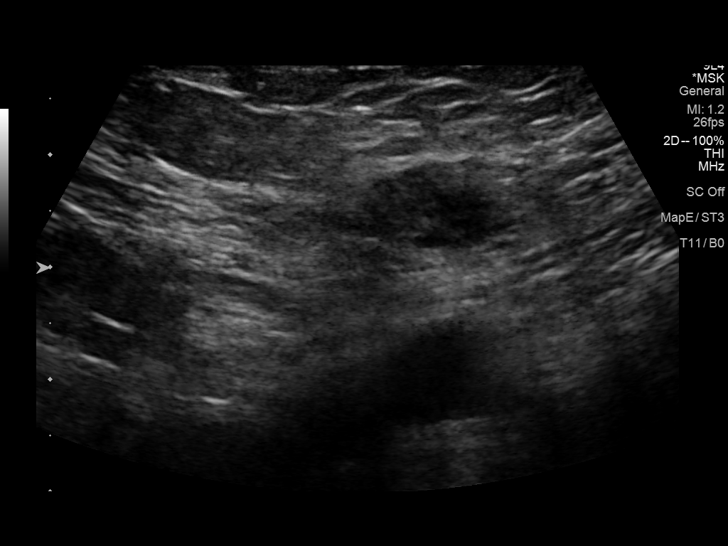
[im 13/23]
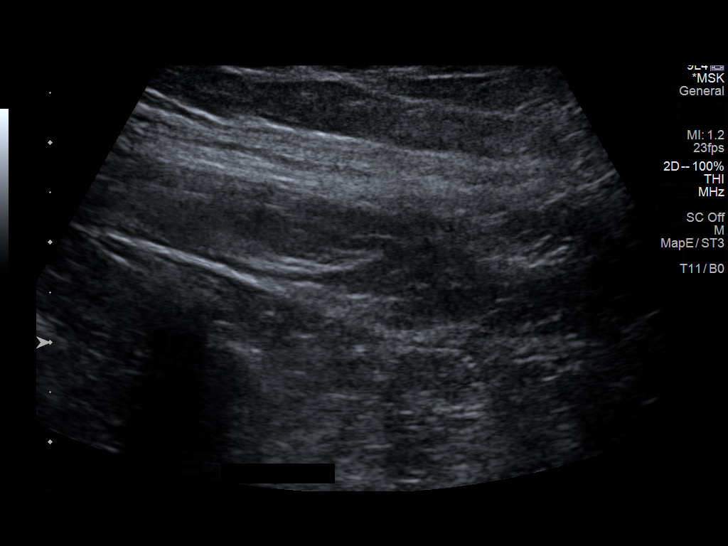
[im 14/23]
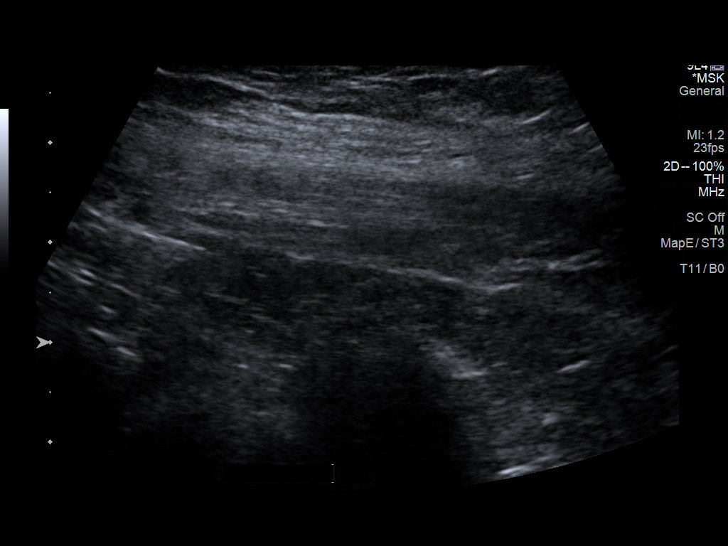
[im 16/23]
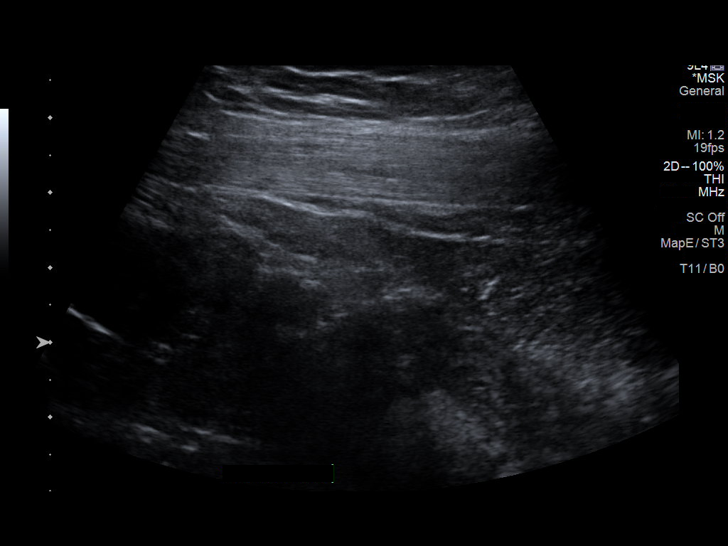
[im 18/23]
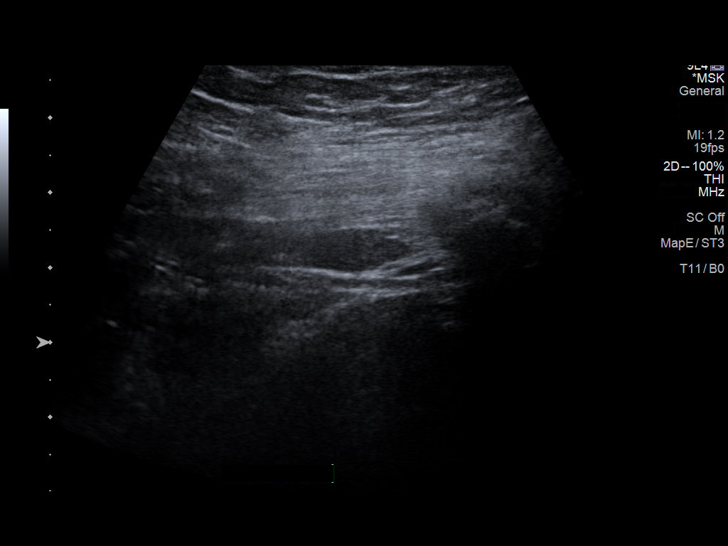
[im 19/23]
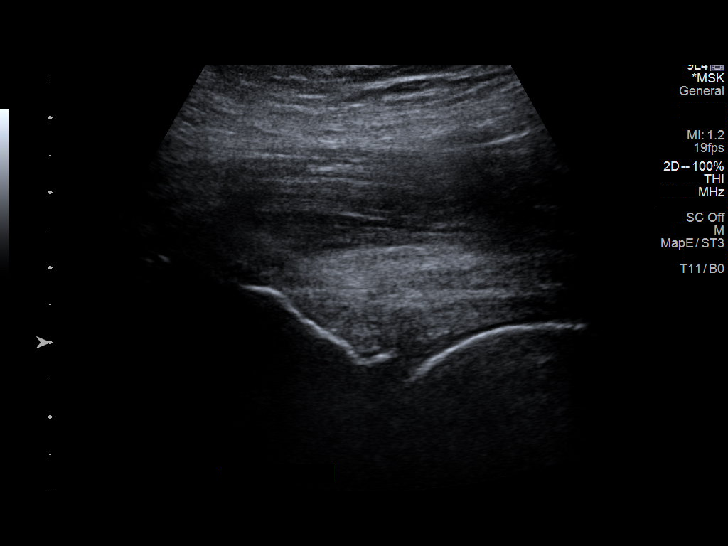
[im 21/23]
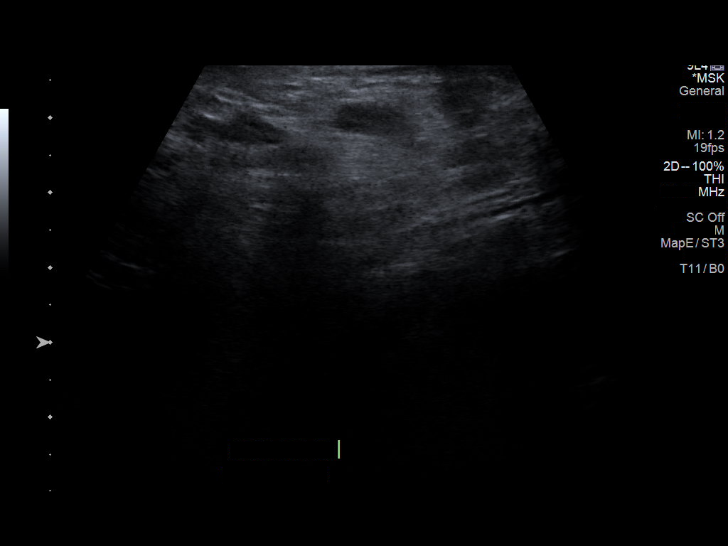
[im 23/23]
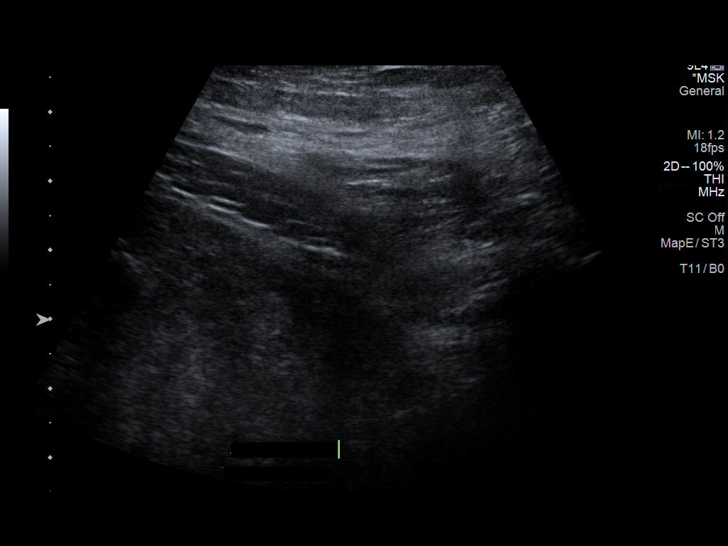

[14 of 23 positions shown; findings below may reference images not displayed]

FINDINGS: Numerous lymph nodes are identified in the area of patient's
concern. Largest lymph node has a cortical thickness of 7
millimeters. No other mass identified. No fluid collection.
IMPRESSION: No suspicious abnormality in the LEFT groin, in the area of concern.
Small lymph nodes do not reach criteria for pathologic enlargement.
No hernia identified.

## 2022-01-26 DIAGNOSIS — D692 Other nonthrombocytopenic purpura: Secondary | ICD-10-CM | POA: Diagnosis not present

## 2022-01-26 DIAGNOSIS — D225 Melanocytic nevi of trunk: Secondary | ICD-10-CM | POA: Diagnosis not present

## 2022-01-26 DIAGNOSIS — L570 Actinic keratosis: Secondary | ICD-10-CM | POA: Diagnosis not present

## 2022-01-26 DIAGNOSIS — L821 Other seborrheic keratosis: Secondary | ICD-10-CM | POA: Diagnosis not present

## 2022-01-26 DIAGNOSIS — L820 Inflamed seborrheic keratosis: Secondary | ICD-10-CM | POA: Diagnosis not present

## 2022-03-06 NOTE — Progress Notes (Unsigned)
Cardiology Office Note:    Date:  03/06/2022   ID:  Laurie Schmidt, DOB 1960/08/07, MRN [id number]  PCP:  Daisy Floro, MD   [facility] Providers Cardiologist:  None {  Referring MD: Daisy Floro, MD    History of Present Illness:    Laurie Schmidt is a 61 y.o. female with a hx of COPD who was referred by Dr. Tenny Craw for further evaluation of DOE and family history of CAD. Notably, her sister is also a patient of ours.  Today, ***  LDL 155, HDL 79, TG 80  Past Medical History:  Diagnosis Date   Allergy    COPD (chronic obstructive pulmonary disease) (HCC)    no meds   Enlarged lymph node    groin area    Past Surgical History:  Procedure Laterality Date   ABDOMINAL HYSTERECTOMY     BLEPHAROPLASTY  10/29/2021   upper & lower   BREAST IMPLANT REMOVAL  11/03/2021   BREAST SURGERY     DILATION AND CURETTAGE OF UTERUS     I & D EXTREMITY Right 11/23/2019   Procedure: IRRIGATION AND DEBRIDEMENT EXTREMITY, right index finger;  Surgeon: Dominica Severin, MD;  Location: [facility];  Service: Orthopedics;  Laterality: Right;   I & D EXTREMITY Right 11/25/2019   Procedure: REPEAT IRRIGATION AND DEBRIDEMENT RIGHT INDEX FINGER;  Surgeon: Dominica Severin, MD;  Location: [facility] OR;  Service: Orthopedics;  Laterality: Right;  60 mins   I & D EXTREMITY Right 02/05/2020   Procedure: IRRIGATION AND DEBRIDEMENT RIGHT INDEX FINGER AND BONE BIOPSY;  Surgeon: Dominica Severin, MD;  Location: [facility];  Service: Orthopedics;  Laterality: Right;  60 mins Block with IV Sed    Current Medications: No outpatient medications have been marked as taking for the 03/07/22 encounter (Appointment) with Meriam Sprague, MD.     Allergies:   Patient has no known allergies.   Social History   Socioeconomic History   Marital status: Single    Spouse name: Not on file   Number of children: Not on file   Years of education: Not on file   Highest education level:  Not on file  Occupational History   Not on file  Tobacco Use   Smoking status: Former    Packs/day: 1.00    Years: 31.00    Total pack years: 31.00    Types: Cigarettes    Quit date: 07/12/2014    Years since quitting: 7.6   Smokeless tobacco: Never  Vaping Use   Vaping Use: Never used  Substance and Sexual Activity   Alcohol use: Yes    Comment: glass of wine or 2 with dinner   Drug use: Never   Sexual activity: Not Currently    Partners: Male    Birth control/protection: Surgical    Comment: 1st intercourse-  13, hysterectomy  Other Topics Concern   Not on file  Social History Narrative   Not on file   Social Determinants of Health   Financial Resource Strain: Not on file  Food Insecurity: Not on file  Transportation Needs: Not on file  Physical Activity: Not on file  Stress: Not on file  Social Connections: Not on file     Family History: The patient's ***family history includes Breast cancer in her maternal grandmother and paternal aunt; Cancer in her father; Healthy in her mother; Heart attack in her maternal grandmother, paternal grandfather, and paternal grandmother.  ROS:   Please  see the history of present illness.    *** All other systems reviewed and are negative.  EKGs/Labs/Other Studies Reviewed:    The following studies were reviewed today: ***  EKG:  EKG is *** ordered today.  The ekg ordered today demonstrates ***  Recent Labs: No results found for requested labs within last 365 days.  Recent Lipid Panel No results found for: "CHOL", "TRIG", "HDL", "CHOLHDL", "VLDL", "LDLCALC", "LDLDIRECT"   Risk Assessment/Calculations:   {Does this patient have ATRIAL FIBRILLATION?[phone]}  No BP recorded.  {Refresh Note OR Click here to enter BP  :1}***         Physical Exam:    VS:  There were no vitals taken for this visit.    Wt Readings from Last 3 Encounters:  12/01/21 157 lb (71.2 kg)  11/04/20 152 lb (68.9 kg)  07/28/20 155 lb 14.4 oz  (70.7 kg)     GEN: *** Well nourished, well developed in no acute distress HEENT: Normal NECK: No JVD; No carotid bruits LYMPHATICS: No lymphadenopathy CARDIAC: ***RRR, no murmurs, rubs, gallops RESPIRATORY:  Clear to auscultation without rales, wheezing or rhonchi  ABDOMEN: Soft, non-tender, non-distended MUSCULOSKELETAL:  No edema; No deformity  SKIN: Warm and dry NEUROLOGIC:  Alert and oriented x 3 PSYCHIATRIC:  Normal affect   ASSESSMENT:    No diagnosis found. PLAN:    In order of problems listed above:  #Dyspnea on Exertion: -Check TTE -Check coronary CTA  #Family History of CAD: -Check NMR panel -Check Lp (a), apolipoprotein B -Coronary CTA as above      {Are you ordering a CV Procedure (e.g. stress test, cath, DCCV, TEE, etc)?   Press F2        :[id number]}    Medication Adjustments/Labs and Tests Ordered: Current medicines are reviewed at length with the patient today.  Concerns regarding medicines are outlined above.  No orders of the defined types were placed in this encounter.  No orders of the defined types were placed in this encounter.   There are no Patient Instructions on file for this visit.   Signed, Meriam Sprague, MD  03/06/2022 8:53 AM    [facility]

## 2022-03-07 ENCOUNTER — Ambulatory Visit: Payer: BC Managed Care – PPO | Attending: Cardiology | Admitting: Cardiology

## 2022-03-07 ENCOUNTER — Encounter: Payer: Self-pay | Admitting: Cardiology

## 2022-03-07 VITALS — BP 110/72 | HR 63 | Ht 70.0 in | Wt 158.8 lb

## 2022-03-07 DIAGNOSIS — R0602 Shortness of breath: Secondary | ICD-10-CM

## 2022-03-07 DIAGNOSIS — R072 Precordial pain: Secondary | ICD-10-CM

## 2022-03-07 DIAGNOSIS — Z0189 Encounter for other specified special examinations: Secondary | ICD-10-CM

## 2022-03-07 DIAGNOSIS — E785 Hyperlipidemia, unspecified: Secondary | ICD-10-CM

## 2022-03-07 DIAGNOSIS — Z79899 Other long term (current) drug therapy: Secondary | ICD-10-CM | POA: Diagnosis not present

## 2022-03-07 MED ORDER — METOPROLOL TARTRATE 50 MG PO TABS: 50.0000 mg | ORAL_TABLET | Freq: Once | ORAL | 0 refills | Status: DC

## 2022-03-07 NOTE — Progress Notes (Signed)
Cardiology Office Note:    Date:  03/07/2022   ID:  Laurie Schmidt, DOB [birth year], MRN [id number]  PCP:  Daisy Floro, MD   [facility] Providers Cardiologist:  None {  Referring MD: Daisy Floro, MD    History of Present Illness:    Laurie Schmidt is a 61 y.o. female with a hx of COPD who was referred by Dr. Tenny Craw for further evaluation of DOE and family history of CAD. Notably, her sister is also a patient of ours.  Referral notes from Dr. Tenny Craw personally reviewed. She was noted to have a family history including hypertension and pacemaker implant in her father. Her paternal grandfather had CAD. Her paternal grandmother had CAD, CHF, and a CABG.  Today, the patient states that she is noticing symptoms similar to what her sister has been experiencing. This similarity and her family history have made her concerned, prompting her visit today.  A few times, she has had episodes of rapid heart rates. She states the episodes occurred relatively in close succession and usually last only a few seconds. Her most recent episode was a couple months ago, and was more concerning as it lasted for several minutes which was atypical. No associated dizziness or presyncopal symptoms. Has not had recent episodes.   For a couple years she has had shortness of breath. She is "really out of breath" with certain activities, such as normal housework, which is concerning to her. No exertional chest pain. She is a former smoker; quit in 07/2014. She had smoked for 30 years.   She states many family members have had CAD, including her father and maternal grandfather.  She denies any chest pain, or peripheral edema. No headaches, syncope, orthopnea, or PND.   Past Medical History:  Diagnosis Date   Allergy    COPD (chronic obstructive pulmonary disease) (HCC)    no meds   Enlarged lymph node    groin area   Hypercholesterolemia    Pulmonary nodules     Past Surgical History:   Procedure Laterality Date   ABDOMINAL HYSTERECTOMY     BLEPHAROPLASTY  10/29/2021   upper & lower   BREAST IMPLANT REMOVAL  11/03/2021   BREAST SURGERY     DILATION AND CURETTAGE OF UTERUS     I & D EXTREMITY Right 11/23/2019   Procedure: IRRIGATION AND DEBRIDEMENT EXTREMITY, right index finger;  Surgeon: Dominica Severin, MD;  Location: [facility];  Service: Orthopedics;  Laterality: Right;   I & D EXTREMITY Right 11/25/2019   Procedure: REPEAT IRRIGATION AND DEBRIDEMENT RIGHT INDEX FINGER;  Surgeon: Dominica Severin, MD;  Location: [facility];  Service: Orthopedics;  Laterality: Right;  60 mins   I & D EXTREMITY Right 02/05/2020   Procedure: IRRIGATION AND DEBRIDEMENT RIGHT INDEX FINGER AND BONE BIOPSY;  Surgeon: Dominica Severin, MD;  Location: [facility];  Service: Orthopedics;  Laterality: Right;  60 mins Block with IV Sed    Current Medications: Current Meds  Medication Sig   Ascorbic Acid (VITAMIN C) 100 MG tablet Take 100 mg by mouth daily.   b complex vitamins tablet Take 1 tablet by mouth daily.   Calcium Carb-Cholecalciferol (CALCIUM 500+D PO) Take 1 tablet by mouth daily.    Coenzyme Q10 (COQ10) 100 MG CAPS Take 100 mg by mouth daily.    fluticasone (FLONASE) 50 MCG/ACT nasal spray Place 1 spray into both nostrils as needed.   ibuprofen (ADVIL,MOTRIN) 200 MG tablet Take 200 mg  by mouth every 6 (six) hours as needed for moderate pain.    loratadine (CLARITIN) 10 MG tablet Take 10 mg by mouth daily.   metoprolol tartrate (LOPRESSOR) 50 MG tablet Take 1 tablet (50 mg total) by mouth once for 1 dose. Take 90-120 minutes prior to scan.   Red Yeast Rice 600 MG CAPS Take 1 capsule by mouth daily.    VITAMIN D PO Take 5,000 Int'l Units by mouth daily.     Allergies:   Patient has no known allergies.   Social History   Socioeconomic History   Marital status: Single    Spouse name: Not on file   Number of children: Not on file   Years of education: Not on file   Highest  education level: Not on file  Occupational History   Not on file  Tobacco Use   Smoking status: Former    Packs/day: 1.00    Years: 31.00    Total pack years: 31.00    Types: Cigarettes    Quit date: 07/12/2014    Years since quitting: 7.6   Smokeless tobacco: Never  Vaping Use   Vaping Use: Never used  Substance and Sexual Activity   Alcohol use: Yes    Comment: glass of wine or 2 with dinner   Drug use: Never   Sexual activity: Not Currently    Partners: Male    Birth control/protection: Surgical    Comment: 1st intercourse-  13, hysterectomy  Other Topics Concern   Not on file  Social History Narrative   Not on file   Social Determinants of Health   Financial Resource Strain: Not on file  Food Insecurity: Not on file  Transportation Needs: Not on file  Physical Activity: Not on file  Stress: Not on file  Social Connections: Not on file     Family History: The patient's family history includes Breast cancer in her maternal grandmother and paternal aunt; Cancer in her father; Healthy in her mother; Heart attack in her maternal grandmother, paternal grandfather, and paternal grandmother.  ROS:   Review of Systems  Constitutional:  Negative for chills and fever.  HENT:  Negative for sinus pain and tinnitus.   Eyes:  Negative for blurred vision and photophobia.  Respiratory:  Positive for shortness of breath. Negative for cough and hemoptysis.   Cardiovascular:  Positive for palpitations (Last episode couple months ago). Negative for chest pain, orthopnea, claudication, leg swelling and PND.  Gastrointestinal:  Negative for constipation and heartburn.  Genitourinary:  Negative for hematuria.  Musculoskeletal:  Negative for falls.  Neurological:  Negative for tingling and seizures.  Psychiatric/Behavioral:  Negative for depression. The patient does not have insomnia.      EKGs/Labs/Other Studies Reviewed:    The following studies were reviewed today:  CT Chest   05/15/2021: FINDINGS: Cardiovascular: No significant vascular findings. Normal heart size. No pericardial effusion.   Mediastinum/Nodes: Esophagus and thyroid are unremarkable. No pathologically enlarged lymph nodes seen in the chest.   Lungs/Pleura: Central airways are patent. Mild centrilobular emphysema. No consolidation, pleural effusion or pneumothorax. Stable small scattered pulmonary nodules.   Upper Abdomen: No acute abnormality.   Musculoskeletal: No chest wall mass or suspicious bone lesions identified.   IMPRESSION: 1. Lung-RADS 2, benign appearance or behavior. Continue annual screening with low-dose chest CT without contrast in 12 months. 2. Emphysema (ICD10-J43.9).  ABI Doppler  04/14/2019: Summary:  Right: Resting right ankle-brachial index is within normal range. No  evidence of significant  right lower extremity arterial disease. The right  toe-brachial index is normal.   Left: Resting left ankle-brachial index is within normal range. No  evidence of significant left lower extremity arterial disease. The left  toe-brachial index is normal.    EKG:  EKG is personally reviewed. 03/07/2022:  Sinus rhythm. Rate 63 bpm.   Recent Labs: No results found for requested labs within last 365 days.   Recent Lipid Panel No results found for: "CHOL", "TRIG", "HDL", "CHOLHDL", "VLDL", "LDLCALC", "LDLDIRECT"   Risk Assessment/Calculations:                Physical Exam:    VS:  BP 110/72   Pulse 63   Ht 5\' 10"  (1.778 m)   Wt 158 lb 12.8 oz (72 kg)   SpO2 98%   BMI 22.79 kg/m     Wt Readings from Last 3 Encounters:  03/07/22 158 lb 12.8 oz (72 kg)  12/01/21 157 lb (71.2 kg)  11/04/20 152 lb (68.9 kg)     GEN: Well nourished, well developed in no acute distress HEENT: Normal NECK: No JVD; No carotid bruits CARDIAC: RRR, no murmurs, rubs, gallops RESPIRATORY:  Clear to auscultation without rales, wheezing or rhonchi  ABDOMEN: Soft, non-tender,  non-distended MUSCULOSKELETAL:  No edema; No deformity  SKIN: Warm and dry NEUROLOGIC:  Alert and oriented x 3 PSYCHIATRIC:  Normal affect   ASSESSMENT:    1. Precordial pain   2. SOB (shortness of breath) on exertion   3. Encounter for laboratory examination   4. Medication management   5. Hyperlipidemia, unspecified hyperlipidemia type    PLAN:    In order of problems listed above:  #Dyspnea on Exertion: Patient reports 2 year history of dyspnea on exertion that occurs with light activity such as housework which is unusual for her. No exertional chest pain but given risk factors including history of tobacco use, HLD, and family history of CAD, will check coronary CTA and TTE -Check TTE -Check coronary CTA  #Family History of CAD: #HLD: -Check NMR panel -Check Lp (a), apolipoprotein B -Coronary CTA as above  #Palpitations: Has had several episodes of heart palpitations several months ago but no recent episodes. Discussed option of monitoring with kardia device or apple watch as I am concerned we will not capture it on a cardiac monitor due to infrequent occurrences. If symptoms become more frequent, can pursue cardiac monitor at that time.          Follow-up:  1 year.  Medication Adjustments/Labs and Tests Ordered: Current medicines are reviewed at length with the patient today.  Concerns regarding medicines are outlined above.   Orders Placed This Encounter  Procedures   CT CORONARY MORPH W/CTA COR W/SCORE W/CA W/CM &/OR WO/CM   Basic metabolic panel   NMR, lipoprofile   Apolipoprotein B   Lipoprotein A (LPA)   EKG 12-Lead   ECHOCARDIOGRAM COMPLETE   Meds ordered this encounter  Medications   metoprolol tartrate (LOPRESSOR) 50 MG tablet    Sig: Take 1 tablet (50 mg total) by mouth once for 1 dose. Take 90-120 minutes prior to scan.    Dispense:  1 tablet    Refill:  0   Patient Instructions  Medication Instructions:   Your physician recommends that you  continue on your current medications as directed. Please refer to the Current Medication list given to you today.  *If you need a refill on your cardiac medications before your next appointment, please call your  pharmacy*   Lab Work:  THIS WEEK HERE IN THE OFFICE--BMET, NMR LIPOPROFILE, LIPOPROTEIN A, APOLIPOPROTEIN B--PLEASE COME FASTING TO THIS LAB APPOINTMENT  If you have labs (blood work) drawn today and your tests are completely normal, you will receive your results only by: MyChart Message (if you have MyChart) OR A paper copy in the mail If you have any lab test that is abnormal or we need to change your treatment, we will call you to review the results.   Testing/Procedures:  Your physician has requested that you have an echocardiogram. Echocardiography is a painless test that uses sound waves to create images of your heart. It provides your doctor with information about the size and shape of your heart and how well your heart's chambers and valves are working. This procedure takes approximately one hour. There are no restrictions for this procedure.    Your cardiac CT will be scheduled at one of the below locations:   [facility] [address] [place], [place] [address] [phone]    If scheduled at [facility], please arrive at the [facility] (Entrance C2) of [facility] 30 minutes prior to test start time. You can use the FREE valet parking offered at entrance C (encouraged to control the heart rate for the test)  Proceed to the [facility] Radiology Department (first floor) to check-in and test prep.  All radiology patients and guests should use entrance C2 at [facility], accessed from [address], even though the hospital's physical address listed is [address].     Please follow these instructions carefully (unless otherwise directed):   On the Night Before the Test: Be sure  to Drink plenty of water. Do not consume any caffeinated/decaffeinated beverages or chocolate 12 hours prior to your test. Do not take any antihistamines 12 hours prior to your test.   On the Day of the Test: Drink plenty of water until 1 hour prior to the test. Do not eat any food 4 hours prior to the test. You may take your regular medications prior to the test.  Take metoprolol 50 MG BY MOUTH  (Lopressor) two hours prior to test. FEMALES- please wear underwire-free bra if available, avoid dresses & tight clothing       After the Test: Drink plenty of water. After receiving IV contrast, you may experience a mild flushed feeling. This is normal. On occasion, you may experience a mild rash up to 24 hours after the test. This is not dangerous. If this occurs, you can take Benadryl 25 mg and increase your fluid intake. If you experience trouble breathing, this can be serious. If it is severe call 911 IMMEDIATELY. If it is mild, please call our office.  We will call to schedule your test 2-4 weeks out understanding that some insurance companies will need an authorization prior to the service being performed.   For non-scheduling related questions, please contact the cardiac imaging nurse navigator should you have any questions/concerns: Rockwell AlexandriaSara Wallace, Cardiac Imaging Nurse Navigator Larey BrickMerle Prescott, Cardiac Imaging Nurse Navigator [facility] Direct Office Dial: [phone]   For scheduling needs, including cancellations and rescheduling, please call [place], [phone].    Follow-Up: At [facility], you and your health needs are our priority.  As part of our continuing mission to provide you with exceptional heart care, we have created designated Provider Care Teams.  These Care Teams include your primary Cardiologist (  physician) and Advanced Practice Providers (APPs -  Physician Assistants and Nurse Practitioners) who all work together to provide  you with the care you need, when you need it.  We recommend signing up for the patient portal called "MyChart".  Sign up information is provided on this After Visit Summary.  MyChart is used to connect with patients for Virtual Visits (Telemedicine).  Patients are able to view lab/test results, encounter notes, upcoming appointments, etc.  Non-urgent messages can be sent to your provider as well.   To learn more about what you can do with MyChart, go to [email or web address].    Your next appointment:   1 year(s)  The format for your next appointment:   In Person  Provider:   DR. Shari Prows   Important Information About Sugar         I,Mathew Stumpf,acting as a scribe for Meriam Sprague, MD.,have documented all relevant documentation on the behalf of Meriam Sprague, MD,as directed by  Meriam Sprague, MD while in the presence of Meriam Sprague, MD.  I, Meriam Sprague, MD, have reviewed all documentation for this visit. The documentation on 03/07/22 for the exam, diagnosis, procedures, and orders are all accurate and complete.   Signed, Meriam Sprague, MD  03/07/2022 4:37 PM    [facility]

## 2022-03-07 NOTE — Patient Instructions (Signed)
Medication Instructions:   Your physician recommends that you continue on your current medications as directed. Please refer to the Current Medication list given to you today.  *If you need a refill on your cardiac medications before your next appointment, please call your pharmacy*   Lab Work:  THIS WEEK HERE IN THE OFFICE--BMET, NMR LIPOPROFILE, LIPOPROTEIN A, APOLIPOPROTEIN B--PLEASE COME FASTING TO THIS LAB APPOINTMENT  If you have labs (blood work) drawn today and your tests are completely normal, you will receive your results only by: MyChart Message (if you have MyChart) OR A paper copy in the mail If you have any lab test that is abnormal or we need to change your treatment, we will call you to review the results.   Testing/Procedures:  Your physician has requested that you have an echocardiogram. Echocardiography is a painless test that uses sound waves to create images of your heart. It provides your doctor with information about the size and shape of your heart and how well your heart's chambers and valves are working. This procedure takes approximately one hour. There are no restrictions for this procedure.    Your cardiac CT will be scheduled at one of the below locations:   [facility] [address] [place], [place] [address] [phone]    If scheduled at [facility], please arrive at the [facility] and Children's Entrance (Entrance C2) of [facility] 30 minutes prior to test start time. You can use the FREE valet parking offered at entrance C (encouraged to control the heart rate for the test)  Proceed to the [facility] Radiology Department (first floor) to check-in and test prep.  All radiology patients and guests should use entrance C2 at [facility], accessed from [facility], even though the hospital's physical address listed is [address].     Please follow these instructions carefully (unless  otherwise directed):   On the Night Before the Test: Be sure to Drink plenty of water. Do not consume any caffeinated/decaffeinated beverages or chocolate 12 hours prior to your test. Do not take any antihistamines 12 hours prior to your test.   On the Day of the Test: Drink plenty of water until 1 hour prior to the test. Do not eat any food 4 hours prior to the test. You may take your regular medications prior to the test.  Take metoprolol 50 MG BY MOUTH  (Lopressor) two hours prior to test. FEMALES- please wear underwire-free bra if available, avoid dresses & tight clothing       After the Test: Drink plenty of water. After receiving IV contrast, you may experience a mild flushed feeling. This is normal. On occasion, you may experience a mild rash up to 24 hours after the test. This is not dangerous. If this occurs, you can take Benadryl 25 mg and increase your fluid intake. If you experience trouble breathing, this can be serious. If it is severe call 911 IMMEDIATELY. If it is mild, please call our office.  We will call to schedule your test 2-4 weeks out understanding that some insurance companies will need an authorization prior to the service being performed.   For non-scheduling related questions, please contact the cardiac imaging nurse navigator should you have any questions/concerns: Rockwell Alexandria, Cardiac Imaging Nurse Navigator Larey Brick, Cardiac Imaging Nurse Navigator [facility] Direct Office Dial: [phone]   For scheduling needs, including cancellations and rescheduling, please call [place], [phone].  Follow-Up: At [facility], you and your health needs are our priority.  As part of our continuing mission to provide you with exceptional heart care, we have created designated Provider Care Teams.  These Care Teams include your primary Cardiologist (physician) and Advanced Practice Providers (APPs -  Physician  Assistants and Nurse Practitioners) who all work together to provide you with the care you need, when you need it.  We recommend signing up for the patient portal called "MyChart".  Sign up information is provided on this After Visit Summary.  MyChart is used to connect with patients for Virtual Visits (Telemedicine).  Patients are able to view lab/test results, encounter notes, upcoming appointments, etc.  Non-urgent messages can be sent to your provider as well.   To learn more about what you can do with MyChart, go to [email or web address].    Your next appointment:   1 year(s)  The format for your next appointment:   In Person  Provider:   DR. Shari Prows   Important Information About Sugar

## 2022-03-08 ENCOUNTER — Telehealth: Payer: Self-pay | Admitting: *Deleted

## 2022-03-08 NOTE — Telephone Encounter (Signed)
-----   Message from Lorrin Jackson sent at 03/08/2022 12:24 PM EDT ----- Regarding: ct heart Scheduled 03/26/22 at 2:00

## 2022-03-09 ENCOUNTER — Ambulatory Visit: Payer: BC Managed Care – PPO | Attending: Cardiology

## 2022-03-09 DIAGNOSIS — Z79899 Other long term (current) drug therapy: Secondary | ICD-10-CM

## 2022-03-09 DIAGNOSIS — E785 Hyperlipidemia, unspecified: Secondary | ICD-10-CM | POA: Diagnosis not present

## 2022-03-09 DIAGNOSIS — Z0189 Encounter for other specified special examinations: Secondary | ICD-10-CM | POA: Diagnosis not present

## 2022-03-09 DIAGNOSIS — R072 Precordial pain: Secondary | ICD-10-CM

## 2022-03-09 DIAGNOSIS — R0602 Shortness of breath: Secondary | ICD-10-CM | POA: Diagnosis not present

## 2022-03-09 DIAGNOSIS — R197 Diarrhea, unspecified: Secondary | ICD-10-CM | POA: Diagnosis not present

## 2022-03-13 DIAGNOSIS — K52832 Lymphocytic colitis: Secondary | ICD-10-CM | POA: Diagnosis not present

## 2022-03-14 LAB — APOLIPOPROTEIN B: Apolipoprotein B: 130 mg/dL — ABNORMAL HIGH (ref <90)

## 2022-03-14 LAB — BASIC METABOLIC PANEL
BUN/Creatinine Ratio: 13 (ref 12–28)
BUN: 11 mg/dL (ref 8–27)
CO2: 25 mmol/L (ref 20–29)
Calcium: 9.3 mg/dL (ref 8.7–10.3)
Chloride: 105 mmol/L (ref 96–106)
Creatinine, Ser: 0.86 mg/dL (ref 0.57–1.00)
Glucose: 90 mg/dL (ref 70–99)
Potassium: 4.7 mmol/L (ref 3.5–5.2)
Sodium: 142 mmol/L (ref 134–144)
eGFR: 76 mL/min/{1.73_m2} (ref >59)

## 2022-03-14 LAB — NMR, LIPOPROFILE
Cholesterol, Total: 255 mg/dL — ABNORMAL HIGH (ref 100–199)
HDL Particle Number: 41.7 umol/L (ref >=30.5)
HDL-C: 86 mg/dL (ref >39)
LDL Particle Number: 1668 nmol/L — ABNORMAL HIGH (ref <1000)
LDL Size: 21.5 nm (ref >20.5)
LDL-C (NIH Calc): 156 mg/dL — ABNORMAL HIGH (ref 0–99)
LP-IR Score: <25 (ref <=45)
Small LDL Particle Number: 268 nmol/L (ref <=527)
Triglycerides: 77 mg/dL (ref 0–149)

## 2022-03-14 LAB — LIPOPROTEIN A (LPA): Lipoprotein (a): 131.5 nmol/L — ABNORMAL HIGH (ref <75.0)

## 2022-03-16 ENCOUNTER — Telehealth: Payer: Self-pay | Admitting: *Deleted

## 2022-03-16 MED ORDER — ROSUVASTATIN CALCIUM 10 MG PO TABS: 10.0000 mg | ORAL_TABLET | Freq: Every day | ORAL | 1 refills | Status: DC

## 2022-03-16 NOTE — Telephone Encounter (Signed)
Loa Socks, LPN  7/0/2637  8:58 AM EDT Back to Top    Left message for the pt to call back for results.   Multiple attempts made at trying to make contact with the pt.  Left her several messages as well.   Will send her results with recommendations to her mychart account for review, for she has been checking this.  Will go ahead and send the pts crestor 10 mg po daily to her pharmacy on file.  Will also advise her via mychart to call back with any additional questions or concerns regarding this result.   Loa Socks, LPN  02/11/276  4:12 AM EDT     Left message for the pt to call back for results.   Loa Socks, LPN  02/13/8675  7:20 PM EDT     Left message for the pt to call back for results.   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 LDL particle is also up but size is normal. Overall, this puts her at higher risk of having cardiovascular disease. I would recommend starting a statin at this time to lower her CV risk. If she is amenable, would recommend starting crestor 10mg  for now with goal to uptitrate to 20mg  as tolerated. We will also follow up her CTA and echo.     Will also discontinue red yeast rice from the pts med list, being crestor 10 mg po daily was sent into her pharmacy on file.   Will endorse to the pt via mychart.

## 2022-03-16 NOTE — Telephone Encounter (Signed)
Pt returned a callback to the office to get her results.   Pt aware that I sent in the crestor 10 mg po daily to her pharmacy on file.  She is aware to stop red yeast rice, for crestor will replace this.  She is aware that we will follow-up with her after her echo and CTA are complete.  Pt verbalized understanding and agrees with this plan.

## 2022-03-16 NOTE — Telephone Encounter (Signed)
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 LDL particle is also up but size is normal. Overall, this puts her at higher risk of having cardiovascular disease. I would recommend starting a statin at this time to lower her CV risk. If she is amenable, would recommend starting crestor 10mg  for now with goal to uptitrate to 20mg  as tolerated. We will also follow up her CTA and echo.

## 2022-03-23 ENCOUNTER — Ambulatory Visit (HOSPITAL_COMMUNITY): Payer: BC Managed Care – PPO | Attending: Cardiology

## 2022-03-23 ENCOUNTER — Telehealth (HOSPITAL_COMMUNITY): Payer: Self-pay | Admitting: *Deleted

## 2022-03-23 DIAGNOSIS — I340 Nonrheumatic mitral (valve) insufficiency: Secondary | ICD-10-CM | POA: Diagnosis not present

## 2022-03-23 DIAGNOSIS — R0602 Shortness of breath: Secondary | ICD-10-CM | POA: Insufficient documentation

## 2022-03-23 DIAGNOSIS — R072 Precordial pain: Secondary | ICD-10-CM | POA: Diagnosis not present

## 2022-03-23 DIAGNOSIS — I517 Cardiomegaly: Secondary | ICD-10-CM

## 2022-03-23 LAB — ECHOCARDIOGRAM COMPLETE
Area-P 1/2: 3.81 cm2
S' Lateral: 2.9 cm

## 2022-03-23 NOTE — Telephone Encounter (Signed)
Attempted to call patient regarding upcoming cardiac CT appointment. °Left message on voicemail with name and callback number ° °Tanza Pellot RN Navigator Cardiac Imaging °[facility] °[phone] Office °[phone] Cell ° °

## 2022-03-26 ENCOUNTER — Ambulatory Visit (HOSPITAL_COMMUNITY)
Admission: RE | Admit: 2022-03-26 | Discharge: 2022-03-26 | Disposition: A | Discharge status: Home / Self Care | Payer: BC Managed Care – PPO | Source: Ambulatory Visit | Attending: Cardiology | Admitting: Cardiology

## 2022-03-26 DIAGNOSIS — R0602 Shortness of breath: Secondary | ICD-10-CM | POA: Diagnosis not present

## 2022-03-26 DIAGNOSIS — R072 Precordial pain: Secondary | ICD-10-CM | POA: Insufficient documentation

## 2022-03-26 MED ORDER — NITROGLYCERIN 0.4 MG SL SUBL: 0.8000 mg | SUBLINGUAL_TABLET | Freq: Once | SUBLINGUAL | Status: AC

## 2022-03-26 MED ORDER — IOHEXOL 350 MG/ML SOLN
95.0000 mL | Freq: Once | INTRAVENOUS | Status: AC | PRN
  Administered 2022-03-26: 95 mL via INTRAVENOUS

## 2022-03-26 MED ORDER — NITROGLYCERIN 0.4 MG SL SUBL
SUBLINGUAL_TABLET | SUBLINGUAL | Status: AC
  Administered 2022-03-26: 0.8 mg via SUBLINGUAL
  Filled 2022-03-26: qty 2

## 2022-04-30 DIAGNOSIS — L820 Inflamed seborrheic keratosis: Secondary | ICD-10-CM | POA: Diagnosis not present

## 2022-04-30 DIAGNOSIS — L578 Other skin changes due to chronic exposure to nonionizing radiation: Secondary | ICD-10-CM | POA: Diagnosis not present

## 2022-04-30 DIAGNOSIS — L570 Actinic keratosis: Secondary | ICD-10-CM | POA: Diagnosis not present

## 2022-04-30 DIAGNOSIS — L720 Epidermal cyst: Secondary | ICD-10-CM | POA: Diagnosis not present

## 2022-04-30 DIAGNOSIS — L821 Other seborrheic keratosis: Secondary | ICD-10-CM | POA: Diagnosis not present

## 2022-05-15 ENCOUNTER — Ambulatory Visit
Admission: RE | Admit: 2022-05-15 | Discharge: 2022-05-15 | Disposition: A | Discharge status: Home / Self Care | Payer: BC Managed Care – PPO | Source: Ambulatory Visit | Attending: Family Medicine | Admitting: Family Medicine

## 2022-05-15 DIAGNOSIS — Z87891 Personal history of nicotine dependence: Secondary | ICD-10-CM

## 2022-05-15 DIAGNOSIS — J432 Centrilobular emphysema: Secondary | ICD-10-CM | POA: Diagnosis not present

## 2022-05-15 DIAGNOSIS — R918 Other nonspecific abnormal finding of lung field: Secondary | ICD-10-CM | POA: Diagnosis not present

## 2022-05-15 DIAGNOSIS — I2510 Atherosclerotic heart disease of native coronary artery without angina pectoris: Secondary | ICD-10-CM | POA: Diagnosis not present

## 2022-05-17 ENCOUNTER — Other Ambulatory Visit: Payer: Self-pay

## 2022-05-17 DIAGNOSIS — Z87891 Personal history of nicotine dependence: Secondary | ICD-10-CM

## 2022-05-17 DIAGNOSIS — Z122 Encounter for screening for malignant neoplasm of respiratory organs: Secondary | ICD-10-CM

## 2022-06-06 DIAGNOSIS — J019 Acute sinusitis, unspecified: Secondary | ICD-10-CM | POA: Diagnosis not present

## 2022-06-06 DIAGNOSIS — Z20822 Contact with and (suspected) exposure to covid-19: Secondary | ICD-10-CM | POA: Diagnosis not present

## 2022-06-08 DIAGNOSIS — E559 Vitamin D deficiency, unspecified: Secondary | ICD-10-CM | POA: Diagnosis not present

## 2022-06-08 DIAGNOSIS — Z0000 Encounter for general adult medical examination without abnormal findings: Secondary | ICD-10-CM | POA: Diagnosis not present

## 2022-06-08 DIAGNOSIS — Z13220 Encounter for screening for lipoid disorders: Secondary | ICD-10-CM | POA: Diagnosis not present

## 2022-06-18 DIAGNOSIS — H40033 Anatomical narrow angle, bilateral: Secondary | ICD-10-CM | POA: Diagnosis not present

## 2022-06-18 DIAGNOSIS — H2513 Age-related nuclear cataract, bilateral: Secondary | ICD-10-CM | POA: Diagnosis not present

## 2022-08-06 DIAGNOSIS — Z1231 Encounter for screening mammogram for malignant neoplasm of breast: Secondary | ICD-10-CM | POA: Diagnosis not present

## 2022-08-08 ENCOUNTER — Encounter: Payer: Self-pay | Admitting: Obstetrics & Gynecology

## 2022-10-15 ENCOUNTER — Other Ambulatory Visit: Payer: Self-pay | Admitting: *Deleted

## 2022-10-15 MED ORDER — ROSUVASTATIN CALCIUM 10 MG PO TABS: 10.0000 mg | ORAL_TABLET | Freq: Every day | ORAL | 1 refills | Status: DC

## 2022-10-22 ENCOUNTER — Telehealth: Payer: Self-pay | Admitting: Cardiology

## 2022-10-22 NOTE — Telephone Encounter (Signed)
Pacey, Brace - 10/22/2022 11:23 AM Meriam Sprague, MD  Sent: Mon October 22, 2022 12:35 PM  To: Loa Socks, LPN         Message  Thank you! Agree with you about ER evaluation.

## 2022-10-22 NOTE — Telephone Encounter (Signed)
Called the pt to inquire more information.  Pt states she was talking to her daughter on the phone last Friday night when symptoms began.  She states while talking to her she noticed she couldn't get her words out or speak.  She states she lost her train of thought.  She said the words were formed in her head as to what she wanted to say to the daughter,  but she could not voice them and get them out.  She states she had NO slurred speech, facial droop, weakness to either side of her body.  She denied any other symptoms like arm drift, headache, chest pain, sob, doe, dizziness, pre-syncopal or syncopal episodes.  She states that for  just  a few short mins she was unable to voice her needs and had trouble getting her words out.  She stated this lasted for only a few mins and went away.  She states she has had no other issues since then.  She states she did not call 911 or go to the ER for evaluation of this.  She did mention about a week prior to that she did feel her heart racing for a couple hours, then that went away and has yet to return.  She did not call our office about that either.  She did not seek any medical assistance for this she said.  Pt states she does not monitor her BP/HR at home but does have a device to do so.  Advised her to start checking and recording this.  Pt is requesting a visit with our office sometime this week for further evaluation.  Pt education provided about s/s of stroke and the importance of seeking immediate medical attention for this.    Also advised her to get in with her PCP as well, to review all systems.  Did schedule her next available appt with our office to see Jacolyn Reedy PA-C for tomorrow 4/16 at 1:15 pm.  Advised her to arrive 15 mins prior to this visit.  ED precautions advised in the interim between now and her appt with [facility] tomorrow, if symptoms return at all.  Pt education provided about this.  Advised her to check her pressures today and  log this and bring to the appt tomorrow.  She is aware that I will forward this plan to Dr. Shari Prows.  Pt verbalized understanding and agrees with this plan.

## 2022-10-22 NOTE — Telephone Encounter (Signed)
  Per MyChart Scheduling message:  I think I had a mild stroke Friday night. I would like to request an appointment to discuss with Dr. Shari Prows

## 2022-10-23 ENCOUNTER — Ambulatory Visit: Payer: BC Managed Care – PPO | Attending: Physician Assistant | Admitting: Physician Assistant

## 2022-10-23 ENCOUNTER — Encounter: Payer: Self-pay | Admitting: Physician Assistant

## 2022-10-23 ENCOUNTER — Ambulatory Visit (INDEPENDENT_AMBULATORY_CARE_PROVIDER_SITE_OTHER): Payer: BC Managed Care – PPO

## 2022-10-23 VITALS — BP 129/85 | HR 59 | Ht 70.0 in | Wt 160.0 lb

## 2022-10-23 DIAGNOSIS — R002 Palpitations: Secondary | ICD-10-CM

## 2022-10-23 DIAGNOSIS — Q2112 Patent foramen ovale: Secondary | ICD-10-CM | POA: Diagnosis not present

## 2022-10-23 DIAGNOSIS — G459 Transient cerebral ischemic attack, unspecified: Secondary | ICD-10-CM

## 2022-10-23 DIAGNOSIS — I2510 Atherosclerotic heart disease of native coronary artery without angina pectoris: Secondary | ICD-10-CM | POA: Diagnosis not present

## 2022-10-23 DIAGNOSIS — E785 Hyperlipidemia, unspecified: Secondary | ICD-10-CM | POA: Diagnosis not present

## 2022-10-23 DIAGNOSIS — Z8249 Family history of ischemic heart disease and other diseases of the circulatory system: Secondary | ICD-10-CM

## 2022-10-23 NOTE — Progress Notes (Unsigned)
Enrolled patient for a 14 day Zio XT monitor to be mailed to patients home  ? ?Dr Pemberton to read ?

## 2022-10-23 NOTE — Progress Notes (Signed)
Cardiology Office Note:    Date:  10/23/2022   ID:  Laurie Schmidt, DOB 04-Apr-1961, MRN [id number]  PCP:  Daisy Floro, MD  [facility] Providers Cardiologist:  None     Referring MD: Daisy Floro, MD   Chief Complaint:  Aphasia     History of Present Illness:   Laurie Schmidt is a 62 y.o. female with history of palpitations, DOE, HLD, family history of CAD.   She saw Dr. Shari Prows 02/2022 complaining of DOE. Coronary CTA mild nonobstructive CAD, echo normal LVEF. She was advised to monitor palpitations via apple watch or kardia monitor.   She called in saying that while talking to her daughter on the phone last Friday night she noticed she couldn't get her words out or speak.  She states she lost her train of thought.  She said the words were formed in her head as to what she wanted to say to the daughter,  but she could not voice them and get them out. Lasted a few minutes. She had a stressful day taking care of her mother. Didn't go to ED. Hasn't happened since.  A week ago she felt her heart racing. Some DOE. Quit smoking 2016 and does have COPD. Has had some elevated BP's.    She states she had NO slurred speech, facial droop, weakness to either side of her body.  She denied any other symptoms like arm drift, headache,              Past Medical History:  Diagnosis Date   Allergy    COPD (chronic obstructive pulmonary disease)    no meds   Enlarged lymph node    groin area   Hypercholesterolemia    Pulmonary nodules    Current Medications: Current Meds  Medication Sig   Ascorbic Acid (VITAMIN C) 100 MG tablet Take 100 mg by mouth daily.   aspirin EC 81 MG tablet Take 81 mg by mouth daily. Swallow whole.   Calcium Carb-Cholecalciferol (CALCIUM 500+D PO) Take 1 tablet by mouth daily.    Coenzyme Q10 (COQ10) 100 MG CAPS Take 100 mg by mouth daily.    loratadine (CLARITIN) 10 MG tablet Take 10 mg by mouth daily.   rosuvastatin (CRESTOR) 10 MG  tablet Take 1 tablet (10 mg total) by mouth daily.   VITAMIN D PO Take 5,000 Int'l Units by mouth daily.    Allergies:   Patient has no known allergies.   Social History   Tobacco Use   Smoking status: Former    Packs/day: 1.00    Years: 31.00    Additional pack years: 0.00    Total pack years: 31.00    Types: Cigarettes    Quit date: 07/12/2014    Years since quitting: 8.2   Smokeless tobacco: Never  Vaping Use   Vaping Use: Never used  Substance Use Topics   Alcohol use: Yes    Comment: glass of wine or 2 with dinner   Drug use: Never    Family Hx: The patient's family history includes Breast cancer in her maternal grandmother and paternal aunt; Cancer in her father; Healthy in her mother; Heart attack in her maternal grandmother, paternal grandfather, and paternal grandmother.  ROS     Physical Exam:    VS:  BP 129/85 (BP Location: Left Arm, Patient Position: Sitting, Cuff Size: Normal)   Pulse (!) 59   Ht  (1.778 m)   Wt 160 lb (72.6  kg)   BMI 22.96 kg/m     Wt Readings from Last 3 Encounters:  10/23/22 160 lb (72.6 kg)  03/07/22 158 lb 12.8 oz (72 kg)  12/01/21 157 lb (71.2 kg)    Physical Exam  GEN: Thin, in no acute distress  Neck: no JVD, carotid bruits, or masses Cardiac:RRR; 2/6 systolic murmur LSB  Respiratory:  clear to auscultation bilaterally, normal work of breathing GI: soft, nontender, nondistended, + BS Ext: without cyanosis, clubbing, or edema, Good distal pulses bilaterally Neuro:  Alert and Oriented x 3,  Psych: euthymic mood, full affect        EKGs/Labs/Other Test Reviewed:    EKG:  EKG is   ordered today.  The ekg ordered today demonstrates  sinus bradycardia 59/m no change  Recent Labs: 03/09/2022: BUN 11; Creatinine, Ser 0.86; Potassium 4.7; Sodium 142   Recent Lipid Panel No results for input(s): "CHOL", "TRIG", "HDL", "VLDL", "LDLCALC", "LDLDIRECT" in the last 8760 hours.   Prior CV Studies:   Coronary CTA  03/2022  FINDINGS: A 100 kV prospective scan was triggered in the descending thoracic aorta at 111 HU's. Axial non-contrast 3 mm slices were carried out through the heart. The data set was analyzed on a dedicated work station and scored using the Agatson method. Gantry rotation speed was 250 msecs and collimation was .6 mm. 0.8 mg of sl NTG was given. The 3D data set was reconstructed in 5% intervals of the 35-75 % of the R-R cycle. Phases were analyzed on a dedicated work station using MPR, MIP and VRT modes. The patient received 100 cc of contrast.   Coronary Arteries:  Normal coronary origin.  Right dominance.   RCA is a large dominant artery that gives rise to PDA and PLA. Noncalcified plaque in the ostial RCA causes 0-24% stenosis   Left main is a large artery that gives rise to LAD and LCX arteries.   LAD is a large vessel. Noncalcified plaque in the proximal LAD causes 25-49% stenosis   LCX is a non-dominant artery that gives rise to one large OM1 branch. There is no plaque.   Other findings:   Left Ventricle: Normal size   Left Atrium: Normal size. PFO   Pulmonary Veins: Normal configuration   Right Ventricle: Normal size   Right Atrium: Normal size   Cardiac valves: No calcifications   Thoracic aorta: Normal size   Pulmonary Arteries: Normal size   Systemic Veins: Normal drainage   Pericardium: Normal thickness   IMPRESSION: 1. Coronary calcium score of 0.   2. Normal coronary origin with right dominance.   3. Nonobstructive CAD   4. Noncalcified plaque in the proximal LAD causes mild (25-49%) stenosis   5. Noncalcified plaque in the ostial RCA causes minimal (0-24%) stenosis   6. PFO   CAD-RADS 2. Mild non-obstructive CAD (25-49%). Consider non-atherosclerotic causes of chest pain. Consider preventive therapy and risk factor modification.   Electronically Signed: By: Epifanio Lesches M.D. On: 03/27/2022 12:56  Echo  03/2022 IMPRESSIONS     1. Left ventricular ejection fraction, by estimation, is 65 to 70%. Left  ventricular ejection fraction by 3D volume is 70 %. The left ventricle has  normal function. The left ventricle has no regional wall motion  abnormalities. Left ventricular diastolic   parameters were normal.   2. Right ventricular systolic function is normal. The right ventricular  size is normal.   3. Left atrial size was mildly dilated.   4. The mitral  valve is normal in structure. Mild mitral valve  regurgitation. No evidence of mitral stenosis.   5. The aortic valve is tricuspid. Aortic valve regurgitation is not  visualized. No aortic stenosis is present.   6. The inferior vena cava is normal in size with greater than 50%  respiratory variability, suggesting right atrial pressure of 3 mmHg.  Risk Assessment/Calculations/Metrics:              ASSESSMENT & PLAN:   No problem-specific Assessment & Plan notes found for this encounter.   Transient aphasia while on the phone. Review of records show PFO noted on CTA 03/2022 but not on echo. Discuseed with Dr. Eldridge Dace DOD. Will refer to neurology, start ASA 81 mg daily, echo with bubble study and reach out to PFO closure team to see if it's appropriate to do since event occurred off ASA. Will also place 2 week zio, and refer to lipid clinic. Check labs today. Go to ED if recurrent symptoms.  PFO noted on coronary CTA but no on echo-will order echo with bubble study  CAD-DOE coronary CTA  03/2022 mild non-obstructive CAD 25-49%-start ASA and lipid clinic  Palpitations-prolonged episode a week prior to event. Place event monitor  HLD- LDL 156 03/2022 on low dose crestor. Will check today and refer to lipid clinic. Need tight control with CAD and possible TIA/CVA  Family history of CAD            Dispo:  No follow-ups on file.   Medication Adjustments/Labs and Tests Ordered: Current medicines are reviewed at length with the patient  today.  Concerns regarding medicines are outlined above.  Tests Ordered: Orders Placed This Encounter  Procedures   Lipid panel   Comprehensive metabolic panel   CBC   Vitamin B12   Vitamin D (25 hydroxy)   Ambulatory referral to Neurology   AMB Referral to [facility]   LONG TERM MONITOR (3-14 DAYS)   EKG 12-Lead   ECHOCARDIOGRAM COMPLETE BUBBLE STUDY   Medication Changes: No orders of the defined types were placed in this encounter.  Elson Clan, PA-C  10/23/2022 2:07 PM    [facility] [address] [place], [place], [place]  [address] Phone: [phone]; Fax: [phone]

## 2022-10-23 NOTE — Patient Instructions (Addendum)
Medication Instructions:   START TAKING ASPIRIN  81 MG ONCE  A DAY    *If you need a refill on your cardiac medications before your next appointment, please call your pharmacy*   Lab Work:  LIPID CMET CBC B12  AND VITAMIN D   If you have labs (blood work) drawn today and your tests are completely normal, you will receive your results only by: MyChart Message (if you have MyChart) OR A paper copy in the mail If you have any lab test that is abnormal or we need to change your treatment, we will call you to review the results.   Testing/Procedures: Your physician has requested that you have an echocardiogram. Echocardiography is a painless test that uses sound waves to create images of your heart. It provides your doctor with information about the size and shape of your heart and how well your heart's chambers and valves are working. This procedure takes approximately one hour. There are no restrictions for this procedure. Please do NOT wear cologne, perfume, aftershave, or lotions (deodorant is allowed). Please arrive 15 minutes prior to your appointment time.   Your physician has recommended that you wear an event monitor. Event monitors are medical devices that record the heart's electrical activity. Doctors most often [place] these monitors to diagnose arrhythmias. Arrhythmias are problems with the speed or rhythm of the heartbeat. The monitor is a small, portable device. You can wear one while you do your normal daily activities. This is usually used to diagnose what is causing palpitations/syncope (passing out).   Follow-Up: At [facility], you and your health needs are our priority.  As part of our continuing mission to provide you with exceptional heart care, we have created designated Provider Care Teams.  These Care Teams include your primary Cardiologist (physician) and Advanced Practice Providers (APPs -  Physician Assistants and Nurse Practitioners) who all work together to  provide you with the care you need, when you need it.  We recommend signing up for the patient portal called "MyChart".  Sign up information is provided on this After Visit Summary.  MyChart is used to connect with patients for Virtual Visits (Telemedicine).  Patients are able to view lab/test results, encounter notes, upcoming appointments, etc.  Non-urgent messages can be sent to your provider as well.   To learn more about what you can do with MyChart, go to [email or web address].    Your next appointment:  You have been referred to Neurology: someone will contact you back for an appointment.   You have been referred: to Lipid Clinic  3 -4  month(s)  Provider:  Dr. Shari Prows   Other Instructions   ZIO XT- Long Term Monitor Instructions  Your physician has requested you wear a ZIO patch monitor for 14 days.  This is a single patch monitor. [employer] supplies one patch monitor per enrollment. Additional stickers are not available. Please do not apply patch if you will be having a Nuclear Stress Test,  Echocardiogram, Cardiac CT, MRI, or Chest Xray during the period you would be wearing the  monitor. The patch cannot be worn during these tests. You cannot remove and re-apply the  ZIO XT patch monitor.  Your ZIO patch monitor will be mailed 3 day USPS to your address on file. It may take 3-5 days  to receive your monitor after you have been enrolled.  Once you have received your monitor, please review the enclosed instructions. Your monitor  has already been registered assigning a  specific monitor serial # to you.  Billing and Patient Assistance Program Information  We have supplied [employer] with any of your insurance information on file for billing purposes. [employer] offers a sliding scale Patient Assistance Program for patients that do not have  insurance, or whose insurance does not completely cover the cost of the ZIO monitor.  You must apply for the Patient Assistance Program to  qualify for this discounted rate.  To apply, please call [employer] at [phone], select option 4, select option 2, ask to apply for  Patient Assistance Program. Meredeth Ide will ask your household income, and how many people  are in your household. They will quote your out-of-pocket cost based on that information.  [employer] will also be able to set up a 70-month, interest-free payment plan if needed.  Applying the monitor   Shave hair from upper left chest.  Hold abrader disc by orange tab. Rub abrader in 40 strokes over the upper left chest as  indicated in your monitor instructions.  Clean area with 4 enclosed alcohol pads. Let dry.  Apply patch as indicated in monitor instructions. Patch will be placed under collarbone on left  side of chest with arrow pointing upward.  Rub patch adhesive wings for 2 minutes. Remove white label marked "1". Remove the white  label marked "2". Rub patch adhesive wings for 2 additional minutes.  While looking in a mirror, press and release button in center of patch. A small green light will  flash 3-4 times. This will be your only indicator that the monitor has been turned on.  Do not shower for the first 24 hours. You may shower after the first 24 hours.  Press the button if you feel a symptom. You will hear a small click. Record Date, Time and  Symptom in the Patient Logbook.  When you are ready to remove the patch, follow instructions on the last 2 pages of Patient  Logbook. Stick patch monitor onto the last page of Patient Logbook.  Place Patient Logbook in the blue and white box. Use locking tab on box and tape box closed  securely. The blue and white box has prepaid postage on it. Please place it in the mailbox as  soon as possible. Your physician should have your test results approximately 7 days after the  monitor has been mailed back to [facility].  Call [facility] Customer Care at [phone] if you have questions regarding  your ZIO XT  patch monitor. Call them immediately if you see an orange light blinking on your  monitor.  If your monitor falls off in less than 4 days, contact our Monitor department at [phone].  If your monitor becomes loose or falls off after 4 days call [employer] at [phone] for  suggestions on securing your monitor

## 2022-10-24 ENCOUNTER — Encounter: Payer: Self-pay | Admitting: Physician Assistant

## 2022-10-24 ENCOUNTER — Telehealth: Payer: Self-pay

## 2022-10-24 DIAGNOSIS — Z79899 Other long term (current) drug therapy: Secondary | ICD-10-CM

## 2022-10-24 LAB — COMPREHENSIVE METABOLIC PANEL
ALT: 19 IU/L (ref 0–32)
AST: 21 IU/L (ref 0–40)
Albumin/Globulin Ratio: 1.9 (ref 1.2–2.2)
Albumin: 4.5 g/dL (ref 3.9–4.9)
Alkaline Phosphatase: 40 IU/L — ABNORMAL LOW (ref 44–121)
BUN/Creatinine Ratio: 13 (ref 12–28)
BUN: 11 mg/dL (ref 8–27)
Bilirubin Total: 0.8 mg/dL (ref 0.0–1.2)
CO2: 22 mmol/L (ref 20–29)
Calcium: 9.6 mg/dL (ref 8.7–10.3)
Chloride: 104 mmol/L (ref 96–106)
Creatinine, Ser: 0.82 mg/dL (ref 0.57–1.00)
Globulin, Total: 2.4 g/dL (ref 1.5–4.5)
Glucose: 93 mg/dL (ref 70–99)
Potassium: 4.7 mmol/L (ref 3.5–5.2)
Sodium: 141 mmol/L (ref 134–144)
Total Protein: 6.9 g/dL (ref 6.0–8.5)
eGFR: 80 mL/min/{1.73_m2} (ref >59)

## 2022-10-24 LAB — CBC
Hematocrit: 41.1 % (ref 34.0–46.6)
Hemoglobin: 13.9 g/dL (ref 11.1–15.9)
MCH: 33.8 pg — ABNORMAL HIGH (ref 26.6–33.0)
MCHC: 33.8 g/dL (ref 31.5–35.7)
MCV: 100 fL — ABNORMAL HIGH (ref 79–97)
Platelets: 254 10*3/uL (ref 150–450)
RBC: 4.11 x10E6/uL (ref 3.77–5.28)
RDW: 11.7 % (ref 11.7–15.4)
WBC: 6.6 10*3/uL (ref 3.4–10.8)

## 2022-10-24 LAB — LIPID PANEL
Chol/HDL Ratio: 2.5 ratio (ref 0.0–4.4)
Cholesterol, Total: 216 mg/dL — ABNORMAL HIGH (ref 100–199)
HDL: 88 mg/dL (ref >39)
LDL Chol Calc (NIH): 114 mg/dL — ABNORMAL HIGH (ref 0–99)
Triglycerides: 80 mg/dL (ref 0–149)
VLDL Cholesterol Cal: 14 mg/dL (ref 5–40)

## 2022-10-24 LAB — VITAMIN D 25 HYDROXY (VIT D DEFICIENCY, FRACTURES): Vit D, 25-Hydroxy: 58.2 ng/mL (ref 30.0–100.0)

## 2022-10-24 LAB — VITAMIN B12: Vitamin B-12: 456 pg/mL (ref 232–1245)

## 2022-10-24 MED ORDER — ROSUVASTATIN CALCIUM 20 MG PO TABS: 20.0000 mg | ORAL_TABLET | Freq: Every day | ORAL | 3 refills | Status: DC

## 2022-10-24 NOTE — Telephone Encounter (Signed)
-----   Message from Dyann Kief, PA-C sent at 10/24/2022 11:30 AM EDT ----- LDL down to 114. Ask her if she can increase crestor 20 mg once daily and repeat in 3 months. Other labs stable including B12 and Vit D. If echo with bubble study shows PFO and neuro thinks she had a TIA Dr. Roby Lofts says they will see her for possible PFO closure. thanks

## 2022-10-24 NOTE — Telephone Encounter (Signed)
Patient aware of results. Pt agreeable to increasing crestor to 20 mg daily and repeating labs on 7/17.

## 2022-10-26 DIAGNOSIS — R002 Palpitations: Secondary | ICD-10-CM

## 2022-11-08 ENCOUNTER — Other Ambulatory Visit (INDEPENDENT_AMBULATORY_CARE_PROVIDER_SITE_OTHER): Payer: BC Managed Care – PPO

## 2022-11-08 ENCOUNTER — Encounter: Payer: Self-pay | Admitting: Physician Assistant

## 2022-11-08 ENCOUNTER — Ambulatory Visit: Payer: BC Managed Care – PPO | Admitting: Physician Assistant

## 2022-11-08 VITALS — BP 137/85 | HR 66 | Resp 18 | Ht 70.0 in | Wt 161.0 lb

## 2022-11-08 DIAGNOSIS — G459 Transient cerebral ischemic attack, unspecified: Secondary | ICD-10-CM

## 2022-11-08 DIAGNOSIS — Z79899 Other long term (current) drug therapy: Secondary | ICD-10-CM

## 2022-11-08 DIAGNOSIS — M79644 Pain in right finger(s): Secondary | ICD-10-CM | POA: Diagnosis not present

## 2022-11-08 LAB — HEMOGLOBIN A1C: Hgb A1c MFr Bld: 5.1 % (ref 4.6–6.5)

## 2022-11-08 NOTE — Patient Instructions (Addendum)
Continue baby aspirin daily. Check MRI of the brain  or structural abnormalities and vascular load MRA head and Neck  Agree that if   bubble study shows PFO, patient would require PFO closure at some point.  Will defer to cardiology. Continue to follow-up at the lipid clinic and cardiology.  Continue Crestor. Check A1C Check B1 Recommend B12 supplementation (MCV 100, B12 on the lower normal 456) Return to clinic in 1 months.

## 2022-11-08 NOTE — Progress Notes (Addendum)
[facility] Clinic Note - Initial Visit   Date: 11/08/22  Laurie Schmidt MRN: [id number] DOB: 1961-06-13   Dear Dr Tenny Craw, Darlen Round, MD:  Thank you for your kind referral of Laurie Schmidt for consultation of recent episode of dysarthria suspicious for TIA. Although her history is well known to you, please allow [place] to reiterate it for the purpose of our medical record. The patient was accompanied to the clinic by her husband who also provides collateral information.     IMPRESSION/PLAN:  Transient episode of dysarthria concerning for TIA    Continue baby aspirin daily. Check MRI of the brain with and without contrast for structural abnormalities and vascular load MRA head and neck to further evaluate vascular abnormalities  Scheduled for echo with bubble to further evaluate PFO.  Will defer closure plans as per  cardiology . Continue to follow-up at the lipid clinic and cardiology.  Continue Crestor. Check A1C Check B1 for memory difficulties .  Recommend B12 supplementation (MCV 100, B12 on the lower normal 456) Return to clinic in 1 months.   History of Present Illness:  Laurie Schmidt is a 62 y.o. R-handed female with a history of palpitations, hyperlipidemia, mild nonobstructive CAD, COPD presenting for evaluation of transient dysarthria suspicious for TIA.  In review, on Friday, 10/19/2022, around 9 PM, the patient was on the phone with her daughter, and was watching the show "password ", and she had an episode of dysarthria.  "My words were not coming out, I was struggling to put my words together ".  The episode lasted for about 10 minutes, and was not preceded by any other symptoms such as headaches, or vision changes.  She denies any facial weakness, or any other signs of a stroke such as limb weakness, numbness, or tingling.  Patient  never had a similar episode. Denies any history of prior TIA. Denies vertigo dizziness, dysphagia.  Denies  any history of seizures.  Denies any taste of blood or metallic taste, muscle tightness.  Denies any chest pain, palpitations or worsening shortness of breath other than her known history of COPD.   Denies any fever or chills, or night sweats, no recent COVID. No tobacco, quit in 2016.  Only new medicines Crestor since last week.  Denies being on hormonal supplements. Does take a baby aspirin since 10/23/2022. Denies any recent long distance trips or recent surgeries. No sick contacts.  There is some stress in her personal life, last 2 months ago, her mother who has vascular dementia, has moved in the house needing some social adjustment "at times can be stressful ". Patient is compliant with his medications. Patient is very active, walking daily. She is not a diabetic. No family history of stroke.  Drinks 2 glasses of  red wine a day ."Memory is not as clear as before for the last 2 years" especially names of people that she knew. Forgets recent conversations.  The other day I was thinking of the word insomnia, but the word that was coming out was hypochondria.  Since the event the memory appears to be more affected than before according to her.  Of note, her mother has a history of vascular dementia.  She is retired from working in a retirement community.  Retired from a retirement community.  Denies any issues driving.  Patient declines performing the MoCA at this time.     Out-side paper records, electronic medical record, and images have been reviewed where available  and summarized as:   No results found for: "HGBA1C" Lab Results  Component Value Date   VITAMINB12 456 10/23/2022   No results found for: "TSH" Lab Results  Component Value Date   ESRSEDRATE 2 01/20/2020   Other pertinent labs: Past Medical History:  Diagnosis Date   Allergy    COPD (chronic obstructive pulmonary disease) (HCC)    no meds   Enlarged lymph node    groin area   Hypercholesterolemia    Pulmonary nodules      Past Surgical History:  Procedure Laterality Date   ABDOMINAL HYSTERECTOMY     BLEPHAROPLASTY  10/29/2021   upper & lower   BREAST IMPLANT REMOVAL  11/03/2021   BREAST SURGERY     DILATION AND CURETTAGE OF UTERUS     I & D EXTREMITY Right 11/23/2019   Procedure: IRRIGATION AND DEBRIDEMENT EXTREMITY, right index finger;  Surgeon: Dominica Severin, MD;  Location: [facility] OR;  Service: Orthopedics;  Laterality: Right;   I & D EXTREMITY Right 11/25/2019   Procedure: REPEAT IRRIGATION AND DEBRIDEMENT RIGHT INDEX FINGER;  Surgeon: Dominica Severin, MD;  Location: [facility] OR;  Service: Orthopedics;  Laterality: Right;  60 mins   I & D EXTREMITY Right 02/05/2020   Procedure: IRRIGATION AND DEBRIDEMENT RIGHT INDEX FINGER AND BONE BIOPSY;  Surgeon: Dominica Severin, MD;  Location: [facility];  Service: Orthopedics;  Laterality: Right;  60 mins Block with IV Sed     Medications:  Outpatient Encounter Medications as of 11/08/2022  Medication Sig   Ascorbic Acid (VITAMIN C) 100 MG tablet Take 100 mg by mouth daily.   aspirin EC 81 MG tablet Take 81 mg by mouth daily. Swallow whole.   Calcium Carb-Cholecalciferol (CALCIUM 500+D PO) Take 1 tablet by mouth daily.    Coenzyme Q10 (COQ10) 100 MG CAPS Take 100 mg by mouth daily.    loratadine (CLARITIN) 10 MG tablet Take 10 mg by mouth daily.   rosuvastatin (CRESTOR) 20 MG tablet Take 1 tablet (20 mg total) by mouth daily.   VITAMIN D PO Take 5,000 Int'l Units by mouth daily.   No facility-administered encounter medications on file as of 11/08/2022.    Allergies: No Known Allergies  Family History: Family History  Problem Relation Age of Onset   Cancer Father        melanoma   Breast cancer Paternal Aunt    Heart attack Maternal Grandmother    Breast cancer Maternal Grandmother    Heart attack Paternal Grandmother    Heart attack Paternal Grandfather    Healthy Mother     Social History: Social History   Tobacco Use   Smoking  status: Former    Packs/day: 1.00    Years: 31.00    Additional pack years: 0.00    Total pack years: 31.00    Types: Cigarettes    Quit date: 07/12/2014    Years since quitting: 8.3   Smokeless tobacco: Never  Vaping Use   Vaping Use: Never used  Substance Use Topics   Alcohol use: Yes    Comment: glass of wine or 2 with dinner   Drug use: Never   Social History   Social History Narrative   Not on file    Vital Signs:  BP 137/85   Pulse 66   Resp 18   Ht 5\' 10"  (1.778 m)   Wt 161 lb (73 kg)   SpO2 98%   BMI 23.10 kg/m    General Medical  Exam:   General:  Well appearing, comfortable.   Eyes/ENT: see cranial nerve examination.   Neck:   No carotid bruits. Respiratory:  Clear to auscultation, good air entry bilaterally.   Cardiac:  Regular rate and rhythm, no murmur.   Extremities:  No deformities, edema, or skin discoloration.  Skin:  No rashes or lesions.  Neurological Exam: MENTAL STATUS including orientation to time, place, person, recent and remote memory, attention span and concentration, language, and fund of knowledge is normal.  Speech is not dysarthric.  CRANIAL NERVES: II:  No visual field defects.   III-IV-VI: Pupils equal round and reactive to light.  Normal conjugate, extra-ocular eye movements in all directions of gaze.  No nystagmus.  No ptosis.   V:  Normal facial sensation.    VII:  Normal facial symmetry and movements.   VIII:  Normal hearing and vestibular function.   IX-X:  Normal palatal movement.   XI:  Normal shoulder shrug and head rotation.   XII:  Normal tongue strength and range of motion, no deviation or fasciculation.  MOTOR:  No atrophy, fasciculations or abnormal movements.  No pronator drift.    SENSORY:  Normal and symmetric perception of light touch, pinprick, vibration, and proprioception.  Romberg's sign absent.   COORDINATION/GAIT: Normal finger-to- nose-finger and heel-to-shin.  Intact rapid alternating movements  bilaterally.  Able to rise from a chair without using arms.  Gait narrow based and stable. Tandem and stressed gait intact.     Total time spent:  50 mins    Thank you for allowing me to participate in patient's care.  If I can answer any additional questions, I would be pleased to do so.    Sincerely,   Marlowe Kays, PA-C

## 2022-11-09 LAB — LIPID PANEL
Chol/HDL Ratio: 6.5 ratio — ABNORMAL HIGH (ref 0.0–4.4)
Cholesterol, Total: 213 mg/dL — ABNORMAL HIGH (ref 100–199)
HDL: 33 mg/dL — ABNORMAL LOW (ref >39)
LDL Chol Calc (NIH): 139 mg/dL — ABNORMAL HIGH (ref 0–99)
Triglycerides: 227 mg/dL — ABNORMAL HIGH (ref 0–149)
VLDL Cholesterol Cal: 41 mg/dL — ABNORMAL HIGH (ref 5–40)

## 2022-11-11 LAB — VITAMIN B1: Vitamin B1 (Thiamine): 51 nmol/L — ABNORMAL HIGH (ref 8–30)

## 2022-11-12 ENCOUNTER — Encounter: Payer: Self-pay | Admitting: Physician Assistant

## 2022-11-12 NOTE — Progress Notes (Signed)
B1 is normal, but is overtherapeutic, it is ok to hold the B1 doses. (It is 51. The upper normal is 30) thanks

## 2022-11-13 ENCOUNTER — Ambulatory Visit: Payer: BC Managed Care – PPO | Admitting: Physician Assistant

## 2022-11-13 ENCOUNTER — Ambulatory Visit: Payer: BC Managed Care – PPO

## 2022-11-16 ENCOUNTER — Telehealth: Payer: Self-pay | Admitting: *Deleted

## 2022-11-16 ENCOUNTER — Ambulatory Visit: Payer: BC Managed Care – PPO | Admitting: Pharmacist

## 2022-11-16 ENCOUNTER — Telehealth: Payer: Self-pay | Admitting: Pharmacist

## 2022-11-16 ENCOUNTER — Ambulatory Visit (HOSPITAL_COMMUNITY): Payer: BC Managed Care – PPO | Attending: Physician Assistant

## 2022-11-16 ENCOUNTER — Telehealth: Payer: Self-pay | Admitting: Cardiology

## 2022-11-16 DIAGNOSIS — Q2112 Patent foramen ovale: Secondary | ICD-10-CM | POA: Insufficient documentation

## 2022-11-16 DIAGNOSIS — E782 Mixed hyperlipidemia: Secondary | ICD-10-CM | POA: Diagnosis not present

## 2022-11-16 DIAGNOSIS — E785 Hyperlipidemia, unspecified: Secondary | ICD-10-CM | POA: Insufficient documentation

## 2022-11-16 DIAGNOSIS — R002 Palpitations: Secondary | ICD-10-CM | POA: Diagnosis not present

## 2022-11-16 LAB — ECHOCARDIOGRAM COMPLETE BUBBLE STUDY
Area-P 1/2: 3.56 cm2
S' Lateral: 2.2 cm

## 2022-11-16 MED ORDER — METOPROLOL TARTRATE 25 MG PO TABS: 25.0000 mg | ORAL_TABLET | Freq: Two times a day (BID) | ORAL | 2 refills | Status: DC

## 2022-11-16 MED ORDER — ROSUVASTATIN CALCIUM 40 MG PO TABS: 40.0000 mg | ORAL_TABLET | Freq: Every day | ORAL | 3 refills | Status: DC

## 2022-11-16 MED ORDER — APIXABAN 5 MG PO TABS: 5.0000 mg | ORAL_TABLET | Freq: Two times a day (BID) | ORAL | 6 refills | Status: DC

## 2022-11-16 MED ORDER — APIXABAN 5 MG PO TABS: 5.0000 mg | ORAL_TABLET | Freq: Two times a day (BID) | ORAL | 0 refills | Status: DC

## 2022-11-16 NOTE — Telephone Encounter (Signed)
-----   Message from Meriam Sprague, MD sent at 11/16/2022  3:01 PM EDT ----- Just saw cardiac monitor. New diagnosis of Afib. Will stop ASA and start apixaban 5mg  BID. Will start metop 25mg  BID. Can we get her in with APP to discuss further?   Thank you!!  -Herbert Seta

## 2022-11-16 NOTE — Telephone Encounter (Signed)
Caller is reporting abnormal Zio patch results.

## 2022-11-16 NOTE — Telephone Encounter (Signed)
   Cardiac Monitor Alert  Date of alert:  11/16/2022   Patient Name: Laurie Schmidt  DOB: 08-09-1960  MRN: [id number]   [facility] Cardiologist: Dr. Lowella [facility] EP:  None    Monitor Information: Long Term Monitor [ZioXT]  Reason:  TIA Ordering provider:  Jacolyn Reedy PA-C   Alert Atrial Fibrillation/Flutter This is the 1st alert for this rhythm.  The patient has no hx of Atrial Fibrillation/Flutter.    Anticoagulation medication as of 11/16/2022           apixaban (ELIQUIS) 5 MG TABS tablet (Taking) Take 1 tablet (5 mg total) by mouth 2 (two) times daily.   apixaban (ELIQUIS) 5 MG TABS tablet (Taking) Take 1 tablet (5 mg total) by mouth 2 (two) times daily.       Next Cardiology Appointment   Date:  11/27/22 at 1:15 pm  Provider:  Jacolyn Reedy PA-C      The patient has been notified of the result and verbalized understanding.  All questions (if any) were answered.  Pt was actually in the office to get her echo and PharmD appt when I provided her with monitor results and recommendations.    She is aware to stop ASA now.  She is aware to start taking Eliquis 5 mg po bid.  Samples provided to the pt while in the office today.  Will be sending our rx prior auth pool a message to be on the lookout for a PA for this medication, and to initiate this accordingly for the pt.  Pt is also aware to start taking metoprolol tartrate 25 mg po bid.    Went ahead and scheduled her next available appt with our our office/APP  for 11/27/22 at 1:15 pm, with Jacolyn Reedy PA-C.  [name] originally ordered the zio monitor and the pt at last OV.  Will include her in on this encounter as well.   Confirmed the pharmacy of choice with the pt.   Pt verbalized understanding and agrees with this plan.

## 2022-11-16 NOTE — Telephone Encounter (Addendum)
Nexlizet PA submitted, Key: [id number].

## 2022-11-16 NOTE — Progress Notes (Signed)
Patient ID: Laurie Schmidt                 DOB: Dec 01, 1960                    MRN: [id number]     HPI: Laurie Schmidt is a 62 y.o. female patient of Dr Shari Prows referred to lipid clinic by Jacolyn Reedy, PA. PMH is significant for HLD, palpitations, DOE, and FHx of CAD. Coronary CTA 03/2022 showed calcium score of 0 and noncalcified plaque in the prox LAD with 25-49% stenosis and 0-24% in the ostial RCA. Questionable recent TIA on 10/19/22 where pt had trouble getting her words out, episode lasted a few minutes, didn't go to the ED and hasn't happened since. She was referred to neurology, started on aspirin 81mg  daily, and referred for echo with bubble study (PFO noted on coronary CTA but not on prior echo). Also had 2 week Zio monitor placed and was referred to lipid clinic.  Pt reports tolerating rosuvastatin well. Recently saw neuro on 11/08/22, reports they recommended closure of her PFO. Echo with bubble study being completed today after lipid visit. Most recent lipid panel from 11/08/22 looks off. TG and HDL historically excellent, HDL 50 points lower on May labs and TG 4x higher, LDL also higher despite recent dose increase of statin. Questionable results/potential lab error, basing therapy off of April labs instead which are more in line with other labs historically.  Current Medications: rosuvastatin 20mg  daily Risk Factors: mild CAD, possible TIA LDL goal: 70mg /dL  Exercise: Walks daily, not as much recently.  Family History: Breast cancer in her maternal grandmother and paternal aunt; Cancer in her father; Heart attack in her maternal grandmother, paternal grandfather, and paternal grandmother.   Social History: Former tobacco use for 31 years, quit smoking in 2016. Occasional alcohol use, no drug use.  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     aspirin EC 81 MG tablet Take 81 mg by mouth daily. Swallow whole.     Calcium Carb-Cholecalciferol (CALCIUM 500+D PO) Take 1 tablet by mouth daily.      Coenzyme Q10 (COQ10) 100 MG CAPS Take 100 mg by mouth daily.      loratadine (CLARITIN) 10 MG tablet Take 10 mg by mouth daily.     rosuvastatin (CRESTOR) 20 MG tablet Take 1 tablet (20 mg total) by mouth daily. 90 tablet 3   VITAMIN D PO Take 5,000 Int'l Units by mouth daily.     No current facility-administered medications on file prior to visit.    No Known Allergies  Assessment/Plan:  1. Hyperlipidemia - LDL above goal < 70 on rosuvastatin 20mg  daily. Will increase rosuvastatin to 40mg  daily. Discussed addition of either Nexlizet or Repatha to bring LDL to goal. Pt prefers Nexlizet, will submit  prior auth and follow up with pt once approved. She will qualify for copay card. Has f/u labs scheduled in 2 months already. Has echo with bubble study today - mentions neuro recommended closure of her PFO. Will forward to Dr Shari Prows for follow up with this once her echo results from today.  Joeanna Howdyshell E. Verna Desrocher, PharmD, BCACP, CPP [facility] [address] [place], [place] [address] Phone: [phone]; Fax: [phone] 11/16/2022 2:57 PM

## 2022-11-16 NOTE — Telephone Encounter (Signed)
The patient has been notified of the result and verbalized understanding.  All questions (if any) were answered.  Pt was actually in the office to get her echo and PharmD appt when I provided her with monitor results and recommendations.    She is aware to stop ASA now.  She is aware to start taking Eliquis 5 mg po bid.  Samples provided to the pt while in the office today.  Will be sending our rx prior auth pool a message to be on the lookout for a PA for this medication, and to initiate this accordingly for the pt.  Pt is also aware to start taking metoprolol tartrate 25 mg po bid.    Went ahead and scheduled her next available appt with our our office/APP  for 11/27/22 at 1:15 pm, with Jacolyn Reedy PA-C.  [name] originally ordered the zio monitor and the pt at last OV.  Will include her in on this encounter as well.   Confirmed the pharmacy of choice with the pt.   Pt verbalized understanding and agrees with this plan.

## 2022-11-16 NOTE — Telephone Encounter (Signed)
Spoke to [place] from  [place] , she reported pt had rapid A-Fib 230 BPM that last 60 seconds,      page 11, strip 10 on  11/06/22 @ 1959.  This was already address please read the notes below.   The patient has been notified of the result and verbalized understanding.  All questions (if any) were answered.   Pt was actually in the office to get her echo and PharmD appt when I provided her with monitor results and recommendations.      She is aware to stop ASA now.   She is aware to start taking Eliquis 5 mg po bid.  Samples provided to the pt while in the office today.  Will be sending our rx prior auth pool a message to be on the lookout for a PA for this medication, and to initiate this accordingly for the pt.   Pt is also aware to start taking metoprolol tartrate 25 mg po bid.     Went ahead and scheduled her next available appt with our our office/APP  for 11/27/22 at 1:15 pm, with Jacolyn Reedy PA-C.  [name] originally ordered the zio monitor and the pt at last OV.  Will include her in on this encounter as well.    Confirmed the pharmacy of choice with the pt.    Pt verbalized understanding and agrees with this plan.      The patient has been notified of the result and verbalized understanding.  All questions (if any) were answered.   Pt was actually in the office to get her echo and PharmD appt when I provided her with monitor results and recommendations.      She is aware to stop ASA now.   She is aware to start taking Eliquis 5 mg po bid.  Samples provided to the pt while in the office today.  Will be sending our rx prior auth pool a message to be on the lookout for a PA for this medication, and to initiate this accordingly for the pt.   Pt is also aware to start taking metoprolol tartrate 25 mg po bid.     Went ahead and scheduled her next available appt with our our office/APP  for 11/27/22 at 1:15 pm, with Jacolyn Reedy PA-C.  [name] originally ordered the zio monitor and  the pt at last OV.  Will include her in on this encounter as well.    Confirmed the pharmacy of choice with the pt.    Pt verbalized understanding and agrees with this plan.     Loa Socks, LPN      1/61/09  3:39 PM Note     Cardiac Monitor Alert   Date of alert:  11/16/2022    Patient Name: Laurie Schmidt  DOB: 09/23/1960  MRN: [id number]    [facility] Cardiologist: Dr. Lowella [facility] EP:  None     Monitor Information: Long Term Monitor [ZioXT]  Reason:  TIA Ordering provider:  Jacolyn Reedy PA-C   Alert Atrial Fibrillation/Flutter This is the 1st alert for this rhythm.  The patient has no hx of Atrial Fibrillation/Flutter.    Anticoagulation medication as of 11/16/2022                apixaban (ELIQUIS) 5 MG TABS tablet (Taking) Take 1 tablet (5 mg total) by mouth 2 (two) times daily.    apixaban (ELIQUIS) 5 MG TABS tablet (Taking) Take 1 tablet (5 mg total)  by mouth 2 (two) times daily.           Next Cardiology Appointment   Date:  11/27/22 at 1:15 pm  Provider:  Jacolyn Reedy PA-C           The patient has been notified of the result and verbalized understanding.  All questions (if any) were answered.   Pt was actually in the office to get her echo and PharmD appt when I provided her with monitor results and recommendations.      She is aware to stop ASA now.   She is aware to start taking Eliquis 5 mg po bid.  Samples provided to the pt while in the office today.  Will be sending our rx prior auth pool a message to be on the lookout for a PA for this medication, and to initiate this accordingly for the pt.   Pt is also aware to start taking metoprolol tartrate 25 mg po bid.     Went ahead and scheduled her next available appt with our our office/APP  for 11/27/22 at 1:15 pm, with Jacolyn Reedy PA-C.  [name] originally ordered the zio monitor and the pt at last OV.  Will include her in on this encounter as well.     Confirmed the pharmacy of choice with the pt.    Pt verbalized understanding and agrees with this plan.

## 2022-11-16 NOTE — Patient Instructions (Addendum)
Your baseline LDL cholesterol is 156 and your goal is < 70  On your most recent labs, your LDL was 114 and 139  Increase your rosuvastatin to 40mg  daily  I'll submit information to your insurance for Nexlizet - 1 tablet daily, and will send you a mychart message once I hear back  Recheck fasting cholesterol in 2 months on Wednesday, July 17th any time after 7:30am

## 2022-11-19 ENCOUNTER — Other Ambulatory Visit (HOSPITAL_COMMUNITY): Payer: Self-pay

## 2022-11-19 ENCOUNTER — Encounter: Payer: Self-pay | Admitting: Pharmacist

## 2022-11-19 MED ORDER — NEXLIZET 180-10 MG PO TABS: 1.0000 | ORAL_TABLET | Freq: Every day | ORAL | 3 refills | Status: DC

## 2022-11-19 NOTE — Telephone Encounter (Signed)
Request still pending.

## 2022-11-19 NOTE — Telephone Encounter (Signed)
Nexlizet PA approved through 11/16/23 and rx sent to pharmacy. Copay card activated, info sent to pt via mychart message. F/u labs already scheduled.

## 2022-11-19 NOTE — Telephone Encounter (Signed)
Per test claim no PA required. Pharmacy already filled for patient on 11/17/22

## 2022-11-20 NOTE — Progress Notes (Signed)
Cardiology Office Note:    Date:  11/27/2022   ID:  Laurie Schmidt, DOB February 27, 1961, MRN [id number]  PCP:  Daisy Floro, MD  [facility] Providers Cardiologist:  None     Referring MD: Daisy Floro, MD   Chief Complaint:  No chief complaint on file.     History of Present Illness:   Laurie Schmidt is a 62 y.o. female with   history of palpitations, DOE, HLD, family history of CAD.    She saw Dr. Shari Prows 02/2022 complaining of DOE. Coronary CTA mild nonobstructive CAD, echo normal LVEF. She was advised to monitor palpitations via apple watch or kardia monitor.    She called in saying that while talking to her daughter on the phone last Friday night she noticed she couldn't get her words out or speak.  She states she lost her train of thought.  She said the words were formed in her head as to what she wanted to say to the daughter,  but she could not voice them and get them out. Lasted a few minutes. She had a stressful day taking care of her mother. Didn't go to ED. Hasn't happened since.  A week ago she felt her heart racing. Some DOE. Quit smoking 2016 and does have COPD. Has had some elevated BP's.    She states she had NO slurred speech, facial droop, weakness to either side of her body.  She denied any other symptoms like arm drift, headache. I ordered an echo with bubble study and it didn't show a PFO. Zio showed Afib and she was started on eliquis and metoprolol. She was also referred to lipid clinic and neuro. Dr. Roby Lofts had said he could see if necessary if neuro thought she had an event.   Patient here with her husband. Has occasional palpitations and fast HR's but nothing prolonged. Neuro told her she should have PFO closure done if she has one.  Scheduled for MRA June 14.  Her husband says she snores a lot.      Past Medical History:  Diagnosis Date   Allergy    COPD (chronic obstructive pulmonary disease) (HCC)    no meds   Enlarged lymph node     groin area   Hypercholesterolemia    Pulmonary nodules    Current Medications: Current Meds  Medication Sig   apixaban (ELIQUIS) 5 MG TABS tablet Take 1 tablet (5 mg total) by mouth 2 (two) times daily.   Ascorbic Acid (VITAMIN C) 100 MG tablet Take 100 mg by mouth daily.   Bempedoic Acid-Ezetimibe (NEXLIZET) 180-10 MG TABS Take 1 tablet by mouth daily.   Calcium Carb-Cholecalciferol (CALCIUM 500+D PO) Take 1 tablet by mouth daily.    Coenzyme Q10 (COQ10) 100 MG CAPS Take 100 mg by mouth daily.    loratadine (CLARITIN) 10 MG tablet Take 10 mg by mouth daily.   metoprolol tartrate (LOPRESSOR) 25 MG tablet Take 1 tablet (25 mg total) by mouth 2 (two) times daily.   Multiple Vitamin (MULTIVITAMIN ADULT PO) Take 1 tablet by mouth daily.   rosuvastatin (CRESTOR) 40 MG tablet Take 1 tablet (40 mg total) by mouth daily.   VITAMIN D PO Take 5,000 Int'l Units by mouth daily.    Allergies:   Patient has no known allergies.   Social History   Tobacco Use   Smoking status: Former    Packs/day: 1.00    Years: 31.00    Additional pack years: 0.00  Total pack years: 31.00    Types: Cigarettes    Quit date: 07/12/2014    Years since quitting: 8.3   Smokeless tobacco: Never  Vaping Use   Vaping Use: Never used  Substance Use Topics   Alcohol use: Yes    Comment: glass of wine or 2 with dinner   Drug use: Never    Family Hx: The patient's family history includes Breast cancer in her maternal grandmother and paternal aunt; Cancer in her father; Healthy in her mother; Heart attack in her maternal grandmother, paternal grandfather, and paternal grandmother.  ROS     Physical Exam:    VS:  BP 110/76   Ht 5\' 10"  (1.778 m)   Wt 157 lb 9.6 oz (71.5 kg)   BMI 22.61 kg/m     Wt Readings from Last 3 Encounters:  11/27/22 157 lb 9.6 oz (71.5 kg)  11/08/22 161 lb (73 kg)  10/23/22 160 lb (72.6 kg)    Physical Exam  GEN: Thin, in no acute distress  Neck: no JVD, carotid bruits, or  masses Cardiac:RRR; no murmurs, rubs, or gallops  Respiratory:  clear to auscultation bilaterally, normal work of breathing GI: soft, nontender, nondistended, + BS Ext: without cyanosis, clubbing, or edema,  Neuro:  Alert and Oriented x 3, Strength and sensation are intact Psych: euthymic mood, full affect        EKGs/Labs/Other Test Reviewed:    EKG:  EKG is   ordered today.  The ekg ordered today demonstrates sinus brady 55/m no acute change  Recent Labs: 10/23/2022: ALT 19; BUN 11; Creatinine, Ser 0.82; Hemoglobin 13.9; Platelets 254; Potassium 4.7; Sodium 141   Recent Lipid Panel Recent Labs    11/08/22 0850  CHOL 213*  TRIG 227*  HDL 33*  LDLCALC 139*     Prior CV Studies:   Zio 11/2022 Patch Wear Time:  13 days and 23 hours (2024-04-19T10:18:22-0400 to 2024-05-03T10:18:18-0400)   Patient had a min HR of 46 bpm, max HR of 233 bpm, and avg HR of 77 bpm. Predominant underlying rhythm was Sinus Rhythm. Atrial Fibrillation occurred (4% burden), ranging from 79-233 bpm (avg of 136 bpm), the longest lasting 6 hours 15 mins with an avg  rate of 119 bpm. Atrial Fibrillation was detected within +/- 45 seconds of symptomatic patient event(s). Isolated SVEs were rare (<1.0%), SVE Couplets were rare (<1.0%), and SVE Triplets were rare (<1.0%). Isolated VEs were occasional (1.3%, 19952), VE  Couplets were rare (<1.0%, 40), and no VE Triplets were present. Ventricular Bigeminy was present. MD notification criteria for Rapid Atrial Fibrillation met - report posted prior to notification per account request (MA).  Echo with bubble study  IMPRESSIONS     1. Left ventricular ejection fraction, by estimation, is 60 to 65%. The  left ventricle has normal function. The left ventricle has no regional  wall motion abnormalities. Left ventricular diastolic parameters were  normal.   2. Right ventricular systolic function is normal. The right ventricular  size is normal.   3. No PFO by clor  or 2D faint /minimal bubbles seen after 10 beats  consistent with trivial non cardiac/pulmonary shunt.   4. The mitral valve is normal in structure. Trivial mitral valve  regurgitation. No evidence of mitral stenosis.   5. The aortic valve is tricuspid. There is mild calcification of the  aortic valve. Aortic valve regurgitation is not visualized. Aortic valve  sclerosis is present, with no evidence of aortic valve stenosis.  6. The inferior vena cava is normal in size with greater than 50%  respiratory variability, suggesting right atrial pressure of 3 mmHg.   Coronary CTA 03/2022   FINDINGS: A 100 kV prospective scan was triggered in the descending thoracic aorta at 111 HU's. Axial non-contrast 3 mm slices were carried out through the heart. The data set was analyzed on a dedicated work station and scored using the Agatson method. Gantry rotation speed was 250 msecs and collimation was .6 mm. 0.8 mg of sl NTG was given. The 3D data set was reconstructed in 5% intervals of the 35-75 % of the R-R cycle. Phases were analyzed on a dedicated work station using MPR, MIP and VRT modes. The patient received 100 cc of contrast.   Coronary Arteries:  Normal coronary origin.  Right dominance.   RCA is a large dominant artery that gives rise to PDA and PLA. Noncalcified plaque in the ostial RCA causes 0-24% stenosis   Left main is a large artery that gives rise to LAD and LCX arteries.   LAD is a large vessel. Noncalcified plaque in the proximal LAD causes 25-49% stenosis   LCX is a non-dominant artery that gives rise to one large OM1 branch. There is no plaque.   Other findings:   Left Ventricle: Normal size   Left Atrium: Normal size. PFO   Pulmonary Veins: Normal configuration   Right Ventricle: Normal size   Right Atrium: Normal size   Cardiac valves: No calcifications   Thoracic aorta: Normal size   Pulmonary Arteries: Normal size   Systemic Veins: Normal drainage    Pericardium: Normal thickness   IMPRESSION: 1. Coronary calcium score of 0.   2. Normal coronary origin with right dominance.   3. Nonobstructive CAD   4. Noncalcified plaque in the proximal LAD causes mild (25-49%) stenosis   5. Noncalcified plaque in the ostial RCA causes minimal (0-24%) stenosis   6. PFO   CAD-RADS 2. Mild non-obstructive CAD (25-49%). Consider non-atherosclerotic causes of chest pain. Consider preventive therapy and risk factor modification.   Electronically Signed: By: Epifanio Lesches M.D. On: 03/27/2022 12:56   Echo 03/2022 IMPRESSIONS     1. Left ventricular ejection fraction, by estimation, is 65 to 70%. Left  ventricular ejection fraction by 3D volume is 70 %. The left ventricle has  normal function. The left ventricle has no regional wall motion  abnormalities. Left ventricular diastolic   parameters were normal.   2. Right ventricular systolic function is normal. The right ventricular  size is normal.   3. Left atrial size was mildly dilated.   4. The mitral valve is normal in structure. Mild mitral valve  regurgitation. No evidence of mitral stenosis.   5. The aortic valve is tricuspid. Aortic valve regurgitation is not  visualized. No aortic stenosis is present.   6. The inferior vena cava is normal in size with greater than 50%  respiratory variability, suggesting right atrial pressure of 3 mmHg.    Risk Assessment/Calculations/Metrics:    CHA2DS2-VASc Score = 4   This indicates a 4.8% annual risk of stroke. The patient's score is based upon: CHF History: 0 HTN History: 0 Diabetes History: 0 Stroke History: 2 Vascular Disease History: 1 Age Score: 0 Gender Score: 1             ASSESSMENT & PLAN:   No problem-specific Assessment & Plan notes found for this encounter.   PAF found on Zio and started on eliquis and  metoprolol-occasional palpitations but nothing prolonged-can take an extra metoprolol if  needed.  Transient aphasia while on the phone. Review of records show PFO noted on CTA 03/2022 but not on echo. Neuro has ordered MRA head neck scheduled for June 14.   PFO noted on coronary CTA but not on initial echo- echo with bubble study reviewed in detail with Dr. Shari Prows. If any PFO extremely small and incidental finding, likely not the cause of TIA as Afib likely cause. Dr. Shari Prows does not recommend PFO closure.   CAD-DOE coronary CTA  03/2022 mild non-obstructive CAD 25-49%-start ASA and lipid clinic started crestor and nexlizet     HLD- LDL 156 03/2022 on low dose crestor. Started on nexlizet  Family history of CAD  Snoring and Afib-will order sleep study.              Dispo:  No follow-ups on file.   Medication Adjustments/Labs and Tests Ordered: Current medicines are reviewed at length with the patient today.  Concerns regarding medicines are outlined above.  Tests Ordered: Orders Placed This Encounter  Procedures   EKG 12-Lead   Itamar Sleep Study   Medication Changes: No orders of the defined types were placed in this encounter.  Signed, Laurie Reedy, PA-C  11/27/2022 1:42 PM    [facility] [address] [place], [place], [place]  [address] Phone: [phone]; Fax: [phone]

## 2022-11-21 ENCOUNTER — Ambulatory Visit: Payer: BC Managed Care – PPO | Admitting: Physician Assistant

## 2022-11-27 ENCOUNTER — Telehealth: Payer: Self-pay

## 2022-11-27 ENCOUNTER — Ambulatory Visit: Payer: BC Managed Care – PPO | Attending: Physician Assistant | Admitting: Physician Assistant

## 2022-11-27 ENCOUNTER — Encounter: Payer: Self-pay | Admitting: Physician Assistant

## 2022-11-27 VITALS — BP 110/76 | Ht 70.0 in | Wt 157.6 lb

## 2022-11-27 DIAGNOSIS — I2510 Atherosclerotic heart disease of native coronary artery without angina pectoris: Secondary | ICD-10-CM

## 2022-11-27 DIAGNOSIS — Z8249 Family history of ischemic heart disease and other diseases of the circulatory system: Secondary | ICD-10-CM

## 2022-11-27 DIAGNOSIS — Q2112 Patent foramen ovale: Secondary | ICD-10-CM | POA: Diagnosis not present

## 2022-11-27 DIAGNOSIS — I4891 Unspecified atrial fibrillation: Secondary | ICD-10-CM

## 2022-11-27 DIAGNOSIS — R0683 Snoring: Secondary | ICD-10-CM

## 2022-11-27 DIAGNOSIS — E7849 Other hyperlipidemia: Secondary | ICD-10-CM

## 2022-11-27 DIAGNOSIS — G459 Transient cerebral ischemic attack, unspecified: Secondary | ICD-10-CM | POA: Diagnosis not present

## 2022-11-27 NOTE — Patient Instructions (Addendum)
Medication Instructions:  Your physician recommends that you continue on your current medications as directed. Please refer to the Current Medication list given to you today.  *If you need a refill on your cardiac medications before your next appointment, please call your pharmacy*   Follow-Up: At [facility], you and your health needs are our priority.  As part of our continuing mission to provide you with exceptional heart care, we have created designated Provider Care Teams.  These Care Teams include your primary Cardiologist (physician) and Advanced Practice Providers (APPs -  Physician Assistants and Nurse Practitioners) who all work together to provide you with the care you need, when you need it.   Your next appointment:   As scheduled  Other: Your physician has recommended that you have a sleep study. This test records several body functions during sleep, including: brain activity, eye movement, oxygen and carbon dioxide blood levels, heart rate and rhythm, breathing rate and rhythm, the flow of air through your mouth and nose, snoring, body muscle movements, and chest and belly movement.

## 2022-11-27 NOTE — Telephone Encounter (Signed)
Itamar sleep study was ordered by Herma Carson, PA in clinic today. Patient was given the device and instructions. She is aware not to open and complete testing until she hears from our office. Device has been registered.

## 2022-12-04 ENCOUNTER — Encounter: Payer: Self-pay | Admitting: Obstetrics & Gynecology

## 2022-12-04 ENCOUNTER — Ambulatory Visit (INDEPENDENT_AMBULATORY_CARE_PROVIDER_SITE_OTHER): Payer: BC Managed Care – PPO | Admitting: Obstetrics & Gynecology

## 2022-12-04 VITALS — BP 116/68 | Ht 70.75 in | Wt 156.0 lb

## 2022-12-04 DIAGNOSIS — S40862A Insect bite (nonvenomous) of left upper arm, initial encounter: Secondary | ICD-10-CM

## 2022-12-04 DIAGNOSIS — Z01419 Encounter for gynecological examination (general) (routine) without abnormal findings: Secondary | ICD-10-CM

## 2022-12-04 DIAGNOSIS — M8588 Other specified disorders of bone density and structure, other site: Secondary | ICD-10-CM

## 2022-12-04 DIAGNOSIS — Z780 Asymptomatic menopausal state: Secondary | ICD-10-CM

## 2022-12-04 DIAGNOSIS — W57XXXA Bitten or stung by nonvenomous insect and other nonvenomous arthropods, initial encounter: Secondary | ICD-10-CM

## 2022-12-04 NOTE — Progress Notes (Signed)
Laurie Schmidt [birth year] [id number]   History:    62 y.o.  G3P1A2L1 Remarried.  Mother lives with her, severe Osteoporosis with Lumbar fracture/My patient as well.   RP:  Established patient presenting for annual gyn exam    HPI: Status post LAVH.  Postmenopause, well on no hormone replacement therapy.  No pelvic pain.  Normal vaginal secretions. Sexually active, no pain.  Pap Neg 11/2021.  No indication to repeat Pap at this time. Urine and bowel movements normal.  Breasts normal, s/p Bilateral saline implants removed in 10/2021.  High risk of Breast Ca.  Seen by Dr Darnelle Catalan.  Stopped Tamoxifen for prophylaxis. Mammo 07/2022 Neg. Body mass index 21.91. Enjoys walking regularly.  Taking vitamin D and calcium supplements.  Bone density 04/2021 Osteopenia Spine -1.4. Will repeat at 3 years at [facility] Colonoscopy in 2019 or 2022, will check with Gastro.  Health labs with family physician.  Past medical history,surgical history, family history and social history were all reviewed and documented in the EPIC chart.  Gynecologic History No LMP recorded. Patient has had a hysterectomy.  Obstetric History OB History  Gravida Para Term Preterm AB Living  3 1 1   2 1   SAB IAB Ectopic Multiple Live Births    2          # Outcome Date GA Lbr Len/2nd Weight Sex Delivery Anes PTL Lv  3 IAB           2 IAB           1 Term              ROS: A ROS was performed and pertinent positives and negatives are included in the history. GENERAL: No fevers or chills. HEENT: No change in vision, no earache, sore throat or sinus congestion. NECK: No pain or stiffness. CARDIOVASCULAR: No chest pain or pressure. No palpitations. PULMONARY: No shortness of breath, cough or wheeze. GASTROINTESTINAL: No abdominal pain, nausea, vomiting or diarrhea, melena or bright red blood per rectum. GENITOURINARY: No urinary frequency, urgency, hesitancy or dysuria. MUSCULOSKELETAL: No joint or muscle pain, no back pain, no recent  trauma. DERMATOLOGIC: No rash, no itching, no lesions. ENDOCRINE: No polyuria, polydipsia, no heat or cold intolerance. No recent change in weight. HEMATOLOGICAL: No anemia or easy bruising or bleeding. NEUROLOGIC: No headache, seizures, numbness, tingling or weakness. PSYCHIATRIC: No depression, no loss of interest in normal activity or change in sleep pattern.     Exam:   BP 116/68 (BP Location: Left Arm, Patient Position: Sitting, Cuff Size: Normal)   Ht 5' 10.75" (1.797 m)   Wt 156 lb (70.8 kg)   BMI 21.91 kg/m   Body mass index is 21.91 kg/m.  General appearance : Well developed well nourished female. No acute distress Skin: Tick at the left arm completely removed. HEENT: Eyes: no retinal hemorrhage or exudates,  Neck supple, trachea midline, no carotid bruits, no thyroidmegaly Lungs: Clear to auscultation, no rhonchi or wheezes, or rib retractions  Heart: Regular rate and rhythm, no murmurs or gallops Breast:Examined in sitting and supine position were symmetrical in appearance, no palpable masses or tenderness,  no skin retraction, no nipple inversion, no nipple discharge, no skin discoloration, no axillary or supraclavicular lymphadenopathy Abdomen: no palpable masses or tenderness, no rebound or guarding Extremities: no edema or skin discoloration or tenderness  Pelvic: Vulva: Normal             Vagina: No gross lesions or discharge  Cervix/Uterus  absent  Adnexa  Without masses or tenderness  Anus: Normal   Assessment/Plan:  62 y.o. female for annual exam   1. Well female exam with routine gynecological exam Status post LAVH.  Postmenopause, well on no hormone replacement therapy.  No pelvic pain.  Normal vaginal secretions. Sexually active, no pain.  Pap Neg 11/2021.  No indication to repeat Pap at this time. Urine and bowel movements normal.  Breasts normal, s/p Bilateral saline implants removed in 10/2021.  High risk of Breast Ca.  Seen by Dr Darnelle Catalan.  Stopped Tamoxifen  for prophylaxis. Mammo 07/2022 Neg. Body mass index 21.91. Enjoys walking regularly.  Taking vitamin D and calcium supplements.  Bone density 04/2021 Osteopenia Spine -1.4. Will repeat at 3 years at [facility]. Colonoscopy in 2019 or 2022, will check with Gastro.  Health labs with family physician.  2. Postmenopausal Status post LAVH.  Postmenopause, well on no hormone replacement therapy.  No pelvic pain.  Normal vaginal secretions. Sexually active, no pain.  3. Osteopenia of lumbar spine  Body mass index 21.91. Enjoys walking regularly.  Taking vitamin D and calcium supplements.  Bone density 04/2021 Osteopenia Spine -1.4. Will repeat at 3 years at [facility].   4. Tick bite of left upper arm, initial encounter Tick completely removed from left upper arm.  Other orders - Cyanocobalamin (B-12 PO); Take by mouth.   Genia Del MD, 3:49 PM

## 2022-12-07 NOTE — Telephone Encounter (Signed)
Pt has been given PIN# [id number] and ok to proceed with sleep study. Pt states she will do the study by early next week at the latest.   Called and made the patient aware that she may proceed with the [facility] Sleep Study. PIN # provided to the patient. Patient made aware that she will be contacted after the test has been read with the results and any recommendations. Patient verbalized understanding and thanked me for the call.

## 2022-12-07 NOTE — Telephone Encounter (Signed)
Prior Authorization for [facility] sent to [facility] via web portal. Tracking Number .  READY -APPROVAL #[id number]/07/22--02/04/23

## 2022-12-14 ENCOUNTER — Encounter (INDEPENDENT_AMBULATORY_CARE_PROVIDER_SITE_OTHER): Payer: BC Managed Care – PPO | Admitting: Cardiology

## 2022-12-14 DIAGNOSIS — R0683 Snoring: Secondary | ICD-10-CM

## 2022-12-18 ENCOUNTER — Encounter: Payer: Self-pay | Admitting: Physician Assistant

## 2022-12-18 ENCOUNTER — Ambulatory Visit: Payer: BC Managed Care – PPO | Attending: Physician Assistant

## 2022-12-18 ENCOUNTER — Ambulatory Visit: Payer: BC Managed Care – PPO | Admitting: Physician Assistant

## 2022-12-18 DIAGNOSIS — Q2112 Patent foramen ovale: Secondary | ICD-10-CM

## 2022-12-18 DIAGNOSIS — I4891 Unspecified atrial fibrillation: Secondary | ICD-10-CM

## 2022-12-18 NOTE — Procedures (Signed)
   SLEEP STUDY REPORT Patient Information Study Date: 12/14/2022 Patient Name: Laurie Schmidt Patient ID: [id number] Birth Date: 1960-12-26 Age: 62 Gender: Female BMI: 22.4 (W=156 lb, H=5' 10'') Referring Physician: Jacolyn Reedy, PA  TEST DESCRIPTION: Home sleep apnea testing was completed using the WatchPat, a Type 1 device, utilizing peripheral arterial tonometry (PAT), chest movement, actigraphy, pulse oximetry, pulse rate, body position and snore. AHI was calculated with apnea and hypopnea using valid sleep time as the denominator. RDI includes apneas, hypopneas, and RERAs. The data acquired and the scoring of sleep and all associated events were performed in accordance with the recommended standards and specifications as outlined in the AASM Manual for the Scoring of Sleep and Associated Events 2.2.0 (2015).   FINDINGS: 1.  No evidence of Obstructive Sleep Apnea with AHI 1.1/hr.  2.  No Central Sleep Apnea. 3.  Oxygen desaturations as low as 88%. 4.  Mild snoring was present. O2 sats were < 88% for 0 minutes. 5.  Total sleep time was 6 hrs and 10 min. 6.  13.6% of total sleep time was spent in REM sleep.  7.  Normal sleep onset latency at 17 min.  8.  Prolonged REM sleep onset latency at 120 min.  9.  Total awakenings were 12.   DIAGNOSIS:  Normal study with no significant sleep disordered breathing.  RECOMMENDATIONS:   1. Normal study with no significant sleep disordered breathing.  2.  Healthy sleep recommendations include:  adequate nightly sleep (normal 7-9 hrs/night), avoidance of caffeine after noon and alcohol near bedtime, and maintaining a sleep environment that is cool, dark and quiet.  3.  Weight loss for overweight patients is recommended.    4.  Snoring recommendations include:  weight loss where appropriate, side sleeping, and avoidance of alcohol before bed.  5.  Operation of motor vehicle or dangerous equipment must be avoided when feeling drowsy,  excessively sleepy, or mentally fatigued.    6.  An ENT consultation which may be useful for specific causes of and possible treatment of bothersome snoring.   7. Weight loss may be of benefit in reducing the severity of snoring.   Signature: Armanda Magic, MD; [facility]; Diplomat, American Board of Sleep Medicine Electronically Signed: 12/18/2022 11:21:22 AM

## 2022-12-19 ENCOUNTER — Telehealth: Payer: Self-pay

## 2022-12-19 NOTE — Telephone Encounter (Signed)
Patient notified of sleep study results and recommendations. Patient verbalized understanding, all questions (if any) were answered.

## 2022-12-19 NOTE — Telephone Encounter (Signed)
-----   Message from Quintella Reichert, MD sent at 12/18/2022 11:22 AM EDT ----- Please let patient know that sleep study showed no significant sleep apnea.

## 2022-12-21 ENCOUNTER — Ambulatory Visit
Admission: RE | Admit: 2022-12-21 | Discharge: 2022-12-21 | Disposition: A | Discharge status: Home / Self Care | Payer: BC Managed Care – PPO | Source: Ambulatory Visit | Attending: Physician Assistant | Admitting: Physician Assistant

## 2022-12-21 DIAGNOSIS — G459 Transient cerebral ischemic attack, unspecified: Secondary | ICD-10-CM | POA: Diagnosis not present

## 2022-12-21 MED ORDER — GADOPICLENOL 0.5 MMOL/ML IV SOLN
8.0000 mL | Freq: Once | INTRAVENOUS | Status: AC | PRN
  Administered 2022-12-21: 8 mL via INTRAVENOUS

## 2022-12-26 ENCOUNTER — Ambulatory Visit: Payer: BC Managed Care – PPO | Admitting: Physician Assistant

## 2022-12-26 ENCOUNTER — Encounter: Payer: Self-pay | Admitting: Physician Assistant

## 2022-12-26 VITALS — BP 140/72 | HR 60 | Resp 20 | Ht 70.0 in | Wt 159.0 lb

## 2022-12-26 DIAGNOSIS — R471 Dysarthria and anarthria: Secondary | ICD-10-CM | POA: Insufficient documentation

## 2022-12-26 NOTE — Progress Notes (Signed)
All images, MRI brain, MRA of the head and neck are normal for age. No acute abnormalities. No stroke. Thanks

## 2022-12-26 NOTE — Progress Notes (Signed)
Assessment/Plan:   Transient episode of dysarthria concerning for TIA  Laurie Schmidt is a very pleasant 62 y.o. RH female seen today in follow up to discuss the imaging results .These were personally reviewed, with normal MRI brain for age without acute intracranial abnormalities, no strokes are noted, normal MRA Head and Neck without LVO, hemodynamically significant stenosis or other vascular abnormality. No aneurysm. In view of the absence of neurological findings, and patient denying any new TIA/stroke like symptoms, follow up is not indicated at this time.     Recommendations    Follow up as needed Continue to control mood as per PCP Continue B1and B12  supplementation  Recommend good control of cardiovascular risk factors. Follow up PFO with Cards.   History of Present Illness 11/08/22:   Laurie Schmidt is a 62 y.o. R-handed female with a history of palpitations, hyperlipidemia, mild nonobstructive CAD, COPD presenting for evaluation of transient dysarthria suspicious for TIA.  In review, on Friday, 10/19/2022, around 9 PM, the patient was on the phone with her daughter, and was watching the show "password ", and she had an episode of dysarthria.  "My words were not coming out, I was struggling to put my words together ".  The episode lasted for about 10 minutes, and was not preceded by any other symptoms such as headaches, or vision changes.  She denies any facial weakness, or any other signs of a stroke such as limb weakness, numbness, or tingling.  Patient  never had a similar episode. Denies any history of prior TIA. Denies vertigo dizziness, dysphagia.  Denies any history of seizures.  Denies any taste of blood or metallic taste, muscle tightness.  Denies any chest pain, palpitations or worsening shortness of breath other than her known history of COPD.   Denies any fever or chills, or night sweats, no recent COVID. No tobacco, quit in 2016.  Only new medicines Crestor since last  week.  Denies being on hormonal supplements. Does take a baby aspirin since 10/23/2022. Denies any recent long distance trips or recent surgeries. No sick contacts.  There is some stress in her personal life, last 2 months ago, her mother who has vascular dementia, has moved in the house needing some social adjustment "at times can be stressful ". Patient is compliant with his medications. Patient is very active, walking daily. She is not a diabetic. No family history of stroke.  Drinks 2 glasses of  red wine a day ."Memory is not as clear as before for the last 2 years" especially names of people that she knew. Forgets recent conversations.  The other day I was thinking of the word insomnia, but the word that was coming out was hypochondria.  Since the event the memory appears to be more affected than before according to her.  Of note, her mother has a history of vascular dementia.  She is retired from working in a retirement community.  Retired from a retirement community.  Denies any issues driving.  Patient declines performing the MoCA at this time.  CURRENT MEDICATIONS:  Outpatient Encounter Medications as of 12/26/2022  Medication Sig   apixaban (ELIQUIS) 5 MG TABS tablet Take 1 tablet (5 mg total) by mouth 2 (two) times daily.   Ascorbic Acid (VITAMIN C) 100 MG tablet Take 100 mg by mouth daily.   Bempedoic Acid-Ezetimibe (NEXLIZET) 180-10 MG TABS Take 1 tablet by mouth daily.   Calcium Carb-Cholecalciferol (CALCIUM 500+D PO) Take 1 tablet by mouth daily.  Coenzyme Q10 (COQ10) 100 MG CAPS Take 100 mg by mouth daily.    Cyanocobalamin (B-12 PO) Take by mouth.   loratadine (CLARITIN) 10 MG tablet Take 10 mg by mouth daily.   metoprolol tartrate (LOPRESSOR) 25 MG tablet Take 1 tablet (25 mg total) by mouth 2 (two) times daily.   Multiple Vitamin (MULTIVITAMIN ADULT PO) Take 1 tablet by mouth daily.   rosuvastatin (CRESTOR) 40 MG tablet Take 1 tablet (40 mg total) by mouth daily.   VITAMIN D PO  Take 5,000 Int'l Units by mouth daily.   No facility-administered encounter medications on file as of 12/26/2022.        No data to display             No data to display         Thank you for allowing [place] the opportunity to participate in the care of this nice patient. Please do not hesitate to contact us for any questions or concerns.   Total time spent on today's visit was 24 minutes dedicated to this patient today, preparing to see patient, examining the patient, ordering tests and/or medications and counseling the patient, documenting clinical information in the EHR or other health record, independently interpreting results and communicating results to the patient/family, discussing treatment and goals, answering patient's questions and coordinating care.  Cc:  Daisy Floro, MD  Marlowe Kays 12/26/2022 7:28 AM

## 2023-01-23 ENCOUNTER — Ambulatory Visit: Payer: BC Managed Care – PPO | Attending: Physician Assistant

## 2023-01-23 DIAGNOSIS — E782 Mixed hyperlipidemia: Secondary | ICD-10-CM

## 2023-01-24 LAB — HEPATIC FUNCTION PANEL
ALT: 32 IU/L (ref 0–32)
AST: 38 IU/L (ref 0–40)
Albumin: 4.5 g/dL (ref 3.9–4.9)
Alkaline Phosphatase: 35 IU/L — ABNORMAL LOW (ref 44–121)
Bilirubin Total: 1.1 mg/dL (ref 0.0–1.2)
Bilirubin, Direct: 0.29 mg/dL (ref 0.00–0.40)
Total Protein: 6.6 g/dL (ref 6.0–8.5)

## 2023-01-24 LAB — LIPID PANEL
Chol/HDL Ratio: 1.9 ratio (ref 0.0–4.4)
Cholesterol, Total: 152 mg/dL (ref 100–199)
HDL: 80 mg/dL (ref >39)
LDL Chol Calc (NIH): 61 mg/dL (ref 0–99)
Triglycerides: 51 mg/dL (ref 0–149)
VLDL Cholesterol Cal: 11 mg/dL (ref 5–40)

## 2023-01-27 ENCOUNTER — Encounter: Payer: Self-pay | Admitting: Cardiology

## 2023-02-05 ENCOUNTER — Encounter: Payer: Self-pay | Admitting: Cardiology

## 2023-03-01 NOTE — Progress Notes (Unsigned)
Office Visit    Patient Name: Laurie Schmidt Date of Encounter: 03/01/2023  Primary Care Provider:  Daisy Floro, MD Primary Cardiologist:  None Primary Electrophysiologist: None   Past Medical History    Past Medical History:  Diagnosis Date   A-fib [facility])    Allergy    COPD (chronic obstructive pulmonary disease) (HCC)    no meds   Enlarged lymph node    groin area   Hypercholesterolemia    Pulmonary nodules    Past Surgical History:  Procedure Laterality Date   ABDOMINAL HYSTERECTOMY     BLEPHAROPLASTY  10/29/2021   upper & lower   BREAST IMPLANT REMOVAL  11/03/2021   BREAST SURGERY     DILATION AND CURETTAGE OF UTERUS     I & D EXTREMITY Right 11/23/2019   Procedure: IRRIGATION AND DEBRIDEMENT EXTREMITY, right index finger;  Surgeon: Dominica Severin, MD;  Location: [facility] OR;  Service: Orthopedics;  Laterality: Right;   I & D EXTREMITY Right 11/25/2019   Procedure: REPEAT IRRIGATION AND DEBRIDEMENT RIGHT INDEX FINGER;  Surgeon: Dominica Severin, MD;  Location: [facility] OR;  Service: Orthopedics;  Laterality: Right;  60 mins   I & D EXTREMITY Right 02/05/2020   Procedure: IRRIGATION AND DEBRIDEMENT RIGHT INDEX FINGER AND BONE BIOPSY;  Surgeon: Dominica Severin, MD;  Location: [facility];  Service: Orthopedics;  Laterality: Right;  60 mins Block with IV Sed    Allergies  No Known Allergies   History of Present Illness    Laurie Schmidt  is a 62year old female with a PMH of, paroxysmal AF (on Eliquis), TIA, nonobstructive CAD, COPD, dyspnea on exertion, palpitations, family history of CAD who presents today for 69-month follow-up.  Laurie Schmidt was seen initially by Dr. Shari Prows in 02/2022 with DOE.  She was noted to have family history of premature CAD and underwent coronary CTA for further evaluation that showed calcium score of 0 and mild nonobstructive CAD with possible PFO.  Patient underwent 2D echo with bubble study that showed no evidence of  PFO and EF of 60 to 65% with no RWMA and trivial MVR with no significant abnormalities noted.  A ZIO monitor which showed 4% burden of AF and patient was started on Eliquis. She was seen last in 11/27/2022 after experiencing  transient aphasia while speaking with her daughter on the phone.  She had a MRA of the head and neck completed 12/2022  that was normal.  She reported ongoing palpitations and was advised to take an additional dose of metoprolol.  She reported history of snoring and underwent sleep study due to new diagnosis of AF that was normal.  She was seen by neurology on 12/26/2022 denied any new TIA/stroke symptoms.  Since last being seen in the office patient reports that she has been doing well with no episodes of tachycardia or shortness of breath.  Her blood pressure today was controlled at 118/66 and heart rate was 63 bpm.  She is tolerating her current medication regimen and denies any adverse reactions.  She does note occasional bouts of fatigue and heart rate sometimes drops to 58 or 59 bpm.  She denied any associated symptoms with decreased heart rate.  She also reports no episodes of aphasia or TIA symptoms since previous visit.  This visit we discussed the importance of primary prevention and the pathophysiology of atrial fibrillation.  Patient denies chest pain, palpitations, dyspnea, PND, orthopnea, nausea, vomiting, dizziness, syncope, edema, weight gain,  or early satiety.    Current Outpatient Medications  Medication Sig Dispense Refill   apixaban (ELIQUIS) 5 MG TABS tablet Take 1 tablet (5 mg total) by mouth 2 (two) times daily. 60 tablet 6   Ascorbic Acid (VITAMIN C) 100 MG tablet Take 100 mg by mouth daily.     Bempedoic Acid-Ezetimibe (NEXLIZET) 180-10 MG TABS Take 1 tablet by mouth daily. 90 tablet 3   Calcium Carb-Cholecalciferol (CALCIUM 500+D PO) Take 1 tablet by mouth daily.      Coenzyme Q10 (COQ10) 100 MG CAPS Take 100 mg by mouth daily.      Cyanocobalamin (B-12 PO)  Take by mouth.     loratadine (CLARITIN) 10 MG tablet Take 10 mg by mouth daily.     metoprolol tartrate (LOPRESSOR) 25 MG tablet Take 1 tablet (25 mg total) by mouth 2 (two) times daily. 180 tablet 2   Multiple Vitamin (MULTIVITAMIN ADULT PO) Take 1 tablet by mouth daily.     rosuvastatin (CRESTOR) 40 MG tablet Take 1 tablet (40 mg total) by mouth daily. 90 tablet 3   VITAMIN D PO Take 5,000 Int'l Units by mouth daily.     No current facility-administered medications for this visit.     Review of Systems  Please see the history of present illness.    (+) Fatigue All other systems reviewed and are otherwise negative except as noted above.  Physical Exam    Wt Readings from Last 3 Encounters:  12/26/22 159 lb (72.1 kg)  12/04/22 156 lb (70.8 kg)  11/27/22 157 lb 9.6 oz (71.5 kg)   ZH:YQMVH were no vitals filed for this visit.,There is no height or weight on file to calculate BMI.  Constitutional:      Appearance: Healthy appearance. Not in distress.  Neck:     Vascular: JVD normal.  Pulmonary:     Effort: Pulmonary effort is normal.     Breath sounds: No wheezing. No rales. Diminished in the bases Cardiovascular:     Normal rate. Regular rhythm. Normal S1. Normal S2.      Murmurs: There is no murmur.  Edema:    Peripheral edema absent.  Abdominal:     Palpations: Abdomen is soft non tender. There is no hepatomegaly.  Skin:    General: Skin is warm and dry.  Neurological:     General: No focal deficit present.     Mental Status: Alert and oriented to person, place and time.     Cranial Nerves: Cranial nerves are intact.  EKG/LABS/ Recent Cardiac Studies    ECG personally reviewed by me today -none completed today   Risk Assessment/Calculations:    CHA2DS2-VASc Score = 4   This indicates a 4.8% annual risk of stroke. The patient's score is based upon: CHF History: 0 HTN History: 0 Diabetes History: 0 Stroke History: 2 Vascular Disease History: 1 Age Score:  0 Gender Score: 1     STOP-Bang Score:  4        Lab Results  Component Value Date   WBC 6.6 10/23/2022   HGB 13.9 10/23/2022   HCT 41.1 10/23/2022   MCV 100 (H) 10/23/2022   PLT 254 10/23/2022   Lab Results  Component Value Date   CREATININE 0.82 10/23/2022   BUN 11 10/23/2022   NA 141 10/23/2022   K 4.7 10/23/2022   CL 104 10/23/2022   CO2 22 10/23/2022   Lab Results  Component Value Date   ALT 32 01/23/2023  AST 38 01/23/2023   ALKPHOS 35 (L) 01/23/2023   BILITOT 1.1 01/23/2023   Lab Results  Component Value Date   CHOL 152 01/23/2023   HDL 80 01/23/2023   LDLCALC 61 01/23/2023   TRIG 51 01/23/2023   CHOLHDL 1.9 01/23/2023    Lab Results  Component Value Date   HGBA1C 5.1 11/08/2022     Assessment & Plan    1.  Paroxysmal atrial fibrillation: -Diagnosed in 11/2022 and currently on rate control with metoprolol -Patient's last creatinine was 0.8 and hemoglobin was 13.6 -Continue Eliquis 5 mg twice daily -Patient was advised to decrease metoprolol to tartrate to 12.5 mg once daily to help with fatigue and lower heart rate. -She will continue to check home heart rate and can also increase to 12.5 if heart rate elevates. -CHA2DS2-VASc Score = 4 [CHF History: 0, HTN History: 0, Diabetes History: 0, Stroke History: 2, Vascular Disease History: 1, Age Score: 0, Gender Score: 1].  Therefore, the patient's annual risk of stroke is 4.8 %.      2.  Nonobstructive CAD: -Cardiac CTA completed 03/2022 -Today patient denied any chest pain shortness of breath. -Continue GDMT with bempedoic acid/ezetimibe 180-10 mg milligrams daily and Crestor 40 mg  3.  Hyperlipidemia: -Patient's last LDL cholesterol was controlled at 61 -Continue bempedoic acid/ezetimibe 180-10 mg milligrams daily and Crestor 40 mg  4.  Dysarthria: -Patient reports no recurrence of dysarthria or TIA symptoms since previous visit.  Disposition: Follow-up with None or APP in 6 months     Medication Adjustments/Labs and Tests Ordered: Current medicines are reviewed at length with the patient today.  Concerns regarding medicines are outlined above.   Signed, Napoleon Form, Leodis Rains, NP 03/01/2023, 11:54 AM [facility]

## 2023-03-04 ENCOUNTER — Encounter: Payer: Self-pay | Admitting: Nurse Practitioner

## 2023-03-04 ENCOUNTER — Ambulatory Visit: Payer: BC Managed Care – PPO | Attending: Nurse Practitioner | Admitting: Nurse Practitioner

## 2023-03-04 VITALS — BP 118/66 | HR 63 | Ht 70.0 in | Wt 158.8 lb

## 2023-03-04 DIAGNOSIS — I2510 Atherosclerotic heart disease of native coronary artery without angina pectoris: Secondary | ICD-10-CM | POA: Diagnosis not present

## 2023-03-04 DIAGNOSIS — R471 Dysarthria and anarthria: Secondary | ICD-10-CM | POA: Diagnosis not present

## 2023-03-04 DIAGNOSIS — E782 Mixed hyperlipidemia: Secondary | ICD-10-CM | POA: Diagnosis not present

## 2023-03-04 DIAGNOSIS — I480 Paroxysmal atrial fibrillation: Secondary | ICD-10-CM | POA: Diagnosis not present

## 2023-03-04 MED ORDER — METOPROLOL TARTRATE 25 MG PO TABS: 12.5000 mg | ORAL_TABLET | Freq: Two times a day (BID) | ORAL | Status: DC

## 2023-03-04 NOTE — Patient Instructions (Addendum)
Medication Instructions:  CHANGE Metoprolol you can take 12.5mg  twice a day  *If you need a refill on your cardiac medications before your next appointment, please call your pharmacy*   Lab Work: None ordered   Testing/Procedures: None ordered   Follow-Up: At [facility], you and your health needs are our priority.  As part of our continuing mission to provide you with exceptional heart care, we have created designated Provider Care Teams.  These Care Teams include your primary Cardiologist (physician) and Advanced Practice Providers (APPs -  Physician Assistants and Nurse Practitioners) who all work together to provide you with the care you need, when you need it.  We recommend signing up for the patient portal called "MyChart".  Sign up information is provided on this After Visit Summary.  MyChart is used to connect with patients for Virtual Visits (Telemedicine).  Patients are able to view lab/test results, encounter notes, upcoming appointments, etc.  Non-urgent messages can be sent to your provider as well.   To learn more about what you can do with MyChart, go to [email or web address].    Your next appointment:   6 month(s)  Provider:   Weston Brass, MD    Other Instructions

## 2023-03-30 DIAGNOSIS — T63421A Toxic effect of venom of ants, accidental (unintentional), initial encounter: Secondary | ICD-10-CM | POA: Diagnosis not present

## 2023-03-30 DIAGNOSIS — R079 Chest pain, unspecified: Secondary | ICD-10-CM | POA: Diagnosis not present

## 2023-03-30 DIAGNOSIS — L509 Urticaria, unspecified: Secondary | ICD-10-CM | POA: Diagnosis not present

## 2023-03-30 DIAGNOSIS — R0789 Other chest pain: Secondary | ICD-10-CM | POA: Diagnosis not present

## 2023-03-30 DIAGNOSIS — T782XXA Anaphylactic shock, unspecified, initial encounter: Secondary | ICD-10-CM | POA: Diagnosis not present

## 2023-03-30 DIAGNOSIS — T7840XA Allergy, unspecified, initial encounter: Secondary | ICD-10-CM | POA: Diagnosis not present

## 2023-04-14 DIAGNOSIS — L509 Urticaria, unspecified: Secondary | ICD-10-CM | POA: Diagnosis not present

## 2023-05-16 ENCOUNTER — Inpatient Hospital Stay
Admission: RE | Admit: 2023-05-16 | Discharge: 2023-05-16 | Disposition: A | Discharge status: Home / Self Care | Payer: BC Managed Care – PPO | Source: Ambulatory Visit | Attending: Family Medicine | Admitting: Family Medicine

## 2023-05-16 DIAGNOSIS — Z122 Encounter for screening for malignant neoplasm of respiratory organs: Secondary | ICD-10-CM

## 2023-05-16 DIAGNOSIS — Z87891 Personal history of nicotine dependence: Secondary | ICD-10-CM | POA: Diagnosis not present

## 2023-06-11 ENCOUNTER — Other Ambulatory Visit: Payer: Self-pay

## 2023-06-11 DIAGNOSIS — Z87891 Personal history of nicotine dependence: Secondary | ICD-10-CM

## 2023-06-11 DIAGNOSIS — Z122 Encounter for screening for malignant neoplasm of respiratory organs: Secondary | ICD-10-CM

## 2023-06-11 DIAGNOSIS — F17210 Nicotine dependence, cigarettes, uncomplicated: Secondary | ICD-10-CM

## 2023-07-04 ENCOUNTER — Other Ambulatory Visit: Payer: Self-pay

## 2023-07-04 MED ORDER — METOPROLOL TARTRATE 25 MG PO TABS: 12.5000 mg | ORAL_TABLET | Freq: Two times a day (BID) | ORAL | 2 refills | Status: AC

## 2023-07-30 DIAGNOSIS — L821 Other seborrheic keratosis: Secondary | ICD-10-CM | POA: Diagnosis not present

## 2023-07-30 DIAGNOSIS — D485 Neoplasm of uncertain behavior of skin: Secondary | ICD-10-CM | POA: Diagnosis not present

## 2023-07-30 DIAGNOSIS — D2372 Other benign neoplasm of skin of left lower limb, including hip: Secondary | ICD-10-CM | POA: Diagnosis not present

## 2023-07-30 DIAGNOSIS — D2262 Melanocytic nevi of left upper limb, including shoulder: Secondary | ICD-10-CM | POA: Diagnosis not present

## 2023-07-30 DIAGNOSIS — L570 Actinic keratosis: Secondary | ICD-10-CM | POA: Diagnosis not present

## 2023-07-30 DIAGNOSIS — D2261 Melanocytic nevi of right upper limb, including shoulder: Secondary | ICD-10-CM | POA: Diagnosis not present

## 2023-08-05 ENCOUNTER — Other Ambulatory Visit: Payer: Self-pay

## 2023-08-05 DIAGNOSIS — I480 Paroxysmal atrial fibrillation: Secondary | ICD-10-CM

## 2023-08-05 MED ORDER — APIXABAN 5 MG PO TABS: 5.0000 mg | ORAL_TABLET | Freq: Two times a day (BID) | ORAL | 5 refills | Status: DC

## 2023-08-05 NOTE — Telephone Encounter (Signed)
Prescription refill request for Eliquis received. Indication: Afib  Last office visit: 03/04/23 Louanne Skye)  Scr: 0.80 (03/30/23)  Age: 63 Weight: 72kg  Appropriate dose. Refill sent.

## 2023-08-12 DIAGNOSIS — Z1231 Encounter for screening mammogram for malignant neoplasm of breast: Secondary | ICD-10-CM | POA: Diagnosis not present

## 2023-08-12 LAB — HM MAMMOGRAPHY

## 2023-08-13 ENCOUNTER — Encounter: Payer: Self-pay | Admitting: Family Medicine

## 2023-08-28 ENCOUNTER — Encounter: Payer: BC Managed Care – PPO | Admitting: Family Medicine

## 2023-09-23 DIAGNOSIS — Z8673 Personal history of transient ischemic attack (TIA), and cerebral infarction without residual deficits: Secondary | ICD-10-CM | POA: Insufficient documentation

## 2023-09-23 DIAGNOSIS — I480 Paroxysmal atrial fibrillation: Secondary | ICD-10-CM | POA: Insufficient documentation

## 2023-09-23 DIAGNOSIS — J438 Other emphysema: Secondary | ICD-10-CM | POA: Insufficient documentation

## 2023-09-24 ENCOUNTER — Encounter: Payer: Self-pay | Admitting: Family Medicine

## 2023-09-24 ENCOUNTER — Ambulatory Visit: Payer: BC Managed Care – PPO | Admitting: Family Medicine

## 2023-09-24 VITALS — BP 118/74 | HR 50 | Ht 71.0 in | Wt 162.6 lb

## 2023-09-24 DIAGNOSIS — E782 Mixed hyperlipidemia: Secondary | ICD-10-CM | POA: Diagnosis not present

## 2023-09-24 DIAGNOSIS — Z8673 Personal history of transient ischemic attack (TIA), and cerebral infarction without residual deficits: Secondary | ICD-10-CM

## 2023-09-24 DIAGNOSIS — Z9189 Other specified personal risk factors, not elsewhere classified: Secondary | ICD-10-CM

## 2023-09-24 DIAGNOSIS — Z860100 Personal history of colon polyps, unspecified: Secondary | ICD-10-CM

## 2023-09-24 DIAGNOSIS — Z0000 Encounter for general adult medical examination without abnormal findings: Secondary | ICD-10-CM

## 2023-09-24 DIAGNOSIS — K52832 Lymphocytic colitis: Secondary | ICD-10-CM | POA: Insufficient documentation

## 2023-09-24 DIAGNOSIS — J438 Other emphysema: Secondary | ICD-10-CM

## 2023-09-24 DIAGNOSIS — I480 Paroxysmal atrial fibrillation: Secondary | ICD-10-CM

## 2023-09-24 DIAGNOSIS — E559 Vitamin D deficiency, unspecified: Secondary | ICD-10-CM | POA: Diagnosis not present

## 2023-09-24 DIAGNOSIS — R471 Dysarthria and anarthria: Secondary | ICD-10-CM

## 2023-09-24 DIAGNOSIS — Z23 Encounter for immunization: Secondary | ICD-10-CM | POA: Diagnosis not present

## 2023-09-24 DIAGNOSIS — Z90710 Acquired absence of both cervix and uterus: Secondary | ICD-10-CM

## 2023-09-24 NOTE — Progress Notes (Unsigned)
 Cardiology Office Note:  .   Date:  09/26/2023  ID:  Laurie Schmidt, DOB [birth year], MRN [id number] PCP: Ronnald Nian, MD  [facility] Providers Cardiologist:  None { History of Present Illness: .    Chief Complaint  Patient presents with   Follow-up         Laurie Schmidt is a 63 y.o. female with history of HLD, CAD, pAF who presents for follow-up.    History of Present Illness   Laurie Schmidt is a 63 year old female with atrial fibrillation and nonobstructive coronary artery disease who presents for follow-up. She was previously followed by Dr. Shari Prows, who has since relocated to [place].  She has a history of atrial fibrillation initially detected on a heart monitor following a transient ischemic attack. She is currently on Eliquis and metoprolol tartrate 12.5 mg twice daily. She experiences heart palpitations or racing heart episodes approximately once or twice a week, lasting from seconds to hours, sometimes triggered by physical exertion such as working in the yard. These episodes are not frequent but are concerning when they occur.  She has nonobstructive coronary artery disease with no significant blockages found on a CT scan of her heart arteries. She is on Crestor 40 mg daily and Nexlizet for hyperlipidemia, with her most recent LDL level being 61, which is within the target range.  She has a patent foramen ovale, which has not been deemed necessary to close at this time.  No high blood pressure, diabetes, or history of heart attack. She has not experienced any chest pain but reports feeling low energy and fatigue, which she attributes to her age. No sleep apnea, confirmed by a previous test.          Problem List CAD -CAC=0 -mild non-calcified plaque LAD (25-49%) 2. HLD -T chol 152, HDL 80, LDL 61, TG 51 3. Paroxysmal Afib -4% burden  -CHADSVASC=4 (female, CAD, TIA) 4. TIA 5. PFO    ROS: All other ROS reviewed and negative. Pertinent positives  noted in the HPI.     Studies Reviewed: Marland Kitchen       CCTA 03/27/2022 IMPRESSION: 1. Coronary calcium score of 0.   2. Normal coronary origin with right dominance.   3. Nonobstructive CAD   4. Noncalcified plaque in the proximal LAD causes mild (25-49%) stenosis   5. Noncalcified plaque in the ostial RCA causes minimal (0-24%) stenosis   6. PFO   CAD-RADS 2. Mild non-obstructive CAD (25-49%). Consider non-atherosclerotic causes of chest pain. Consider preventive therapy and risk factor modification.  TTE 11/16/2022  1. Left ventricular ejection fraction, by estimation, is 60 to 65%. The  left ventricle has normal function. The left ventricle has no regional  wall motion abnormalities. Left ventricular diastolic parameters were  normal.   2. Right ventricular systolic function is normal. The right ventricular  size is normal.   3. No PFO by clor or 2D faint /minimal bubbles seen after 10 beats  consistent with trivial non cardiac/pulmonary shunt.   4. The mitral valve is normal in structure. Trivial mitral valve  regurgitation. No evidence of mitral stenosis.   5. The aortic valve is tricuspid. There is mild calcification of the  aortic valve. Aortic valve regurgitation is not visualized. Aortic valve  sclerosis is present, with no evidence of aortic valve stenosis.   6. The inferior vena cava is normal in size with greater than 50%  respiratory variability, suggesting right atrial pressure of 3 mmHg.  Physical Exam:   VS:  BP 108/72 (BP Location: Left Arm, Patient Position: Sitting, Cuff Size: Normal)   Pulse (!) 52   Ht 5\' 11"  (1.803 m)   Wt 163 lb (73.9 kg)   SpO2 100%   BMI 22.73 kg/m    Wt Readings from Last 3 Encounters:  09/26/23 163 lb (73.9 kg)  09/24/23 162 lb 9.6 oz (73.8 kg)  03/04/23 158 lb 12.8 oz (72 kg)    GEN: Well nourished, well developed in no acute distress NECK: No JVD; No carotid bruits CARDIAC: RRR, no murmurs, rubs, gallops RESPIRATORY:  Clear  to auscultation without rales, wheezing or rhonchi  ABDOMEN: Soft, non-tender, non-distended EXTREMITIES:  No edema; No deformity  ASSESSMENT AND PLAN: .   Assessment and Plan    Paroxysmal Atrial Fibrillation (AFib) Paroxysmal AFib with a 4% burden. Symptoms occur 1-2 times per week. CHADS-VASc score of 4 necessitates anticoagulation. Eliquis prescribed for stroke prevention. Discussed ablation as a potential treatment to reduce symptoms and medication reliance. Explained ablation procedure and potential combination with Watchman. She is interested but apprehensive. Alternative options include stronger rhythm control medications. - Order two-week ZO monitor to quantify AFib burden. - Continue metoprolol tartrate 12.5 mg BID. - Continue Eliquis 5 mg BID indefinitely. - Consider referral for ablation based on monitor results. - Consider rhythm control medications if needed.  Nonobstructive Coronary Artery Disease (CAD) Nonobstructive CAD with controlled LDL levels. Echocardiogram normal. - Continue Crestor 40 mg daily. - Continue Nexlizet. - No aspirin due to Eliquis use.  Hyperlipidemia Hyperlipidemia well-managed. LDL at target level of 61. - Continue Crestor 40 mg daily. - Continue Nexlizet.  Patent Foramen Ovale (PFO) PFO present, no current intervention required.  Follow-up Plan to reassess AFib burden and symptoms post-monitoring. Discussed ablation and alternative treatments based on results. - Schedule yearly follow-up appointment. - Contact with monitor results and further recommendations. - Consider referral to ablation specialist if AFib burden is significant.              Follow-up: Return in about 1 year (around 09/25/2024).   Signed, Lenna Gilford. Flora Lipps, MD, [facility]  [facility]  [address] [place], [place] [address] [phone]  2:08 PM

## 2023-09-24 NOTE — Progress Notes (Signed)
 Complete physical exam  Patient: Laurie Schmidt   DOB: 1960-09-10   63 y.o. Female  MRN: [id number]  Subjective:    Chief Complaint  Patient presents with   Annual Exam    Fasting annual exam. Asking for B/L breast ultrasound, due to dense breast tissue. Is looking for new GYN, I gave her names.     Laurie Schmidt is a 63 y.o. female who presents today for a complete physical exam.  She reports consuming a  regular  diet.  Walks 6 days a week.  She generally feels well. She reports sleeping well.  She does have a history of PAF and is on Eliquis and also metoprolol.  She does follow-up with cardiology concerning this.  Also has x-ray evidence of atherosclerosis of the aorta and had a previous TIA.  Presently she is also taking Crestor.  In the past she did have evidence of lymphocytic colitis and was cared for by [facility].  She also has a history of colonic polyps and is scheduled for repeat in 5 years.  She also has a previous history of vitamin D deficiency.  She is a previous smoker quitting in January 2016.  She has had a previous scan which was negative.  She also has x-ray evidence of COPD.  She also is at risk for breast cancer.  She was seen by Dr. Darnelle Catalan who did an evaluation indicating a 22% likelihood of breast cancer.  He did an MRI on her with this in mind and also placed her on tamoxifen however she has not taken tamoxifen.  Most recent fall risk assessment:    09/24/2023   10:48 AM  Fall Risk   Falls in the past year? 0  Number falls in past yr: 0  Injury with Fall? 0  Risk for fall due to : No Fall Risks  Follow up Falls evaluation completed     Most recent depression screenings:    09/24/2023   10:49 AM 03/10/2020    3:49 PM  PHQ 2/9 Scores  PHQ - 2 Score 0 1  PHQ- 9 Score  6    Vision:Within last year     Immunization History  Administered Date(s) Administered   Influenza-Unspecified 05/03/2023   Moderna Covid-19 Fall Seasonal Vaccine 55yrs & older  05/03/2023   Tdap 01/10/2018, 02/15/2019   Zoster Recombinant(Shingrix) 02/15/2019, 09/07/2019    Health Maintenance  Topic Date Due   Pneumococcal Vaccine 39-75 Years old (1 of 2 - PCV) Never done   Colonoscopy  Never done   COVID-19 Vaccine (2 - Moderna risk series) 10/10/2023 (Originally 05/31/2023)   Lung Cancer Screening  05/15/2024   MAMMOGRAM  08/11/2025   Cervical Cancer Screening (HPV/Pap Cotest)  12/02/2026   DTaP/Tdap/Td (3 - Td or Tdap) 02/14/2029   INFLUENZA VACCINE  Completed   Hepatitis C Screening  Completed   HIV Screening  Completed   Zoster Vaccines- Shingrix  Completed   HPV VACCINES  Aged Out    Patient Care Team: Ronnald Nian, MD as PCP - General (Family Medicine) Drema Halon, MD (Inactive) as Consulting Physician (Otolaryngology) Genia Del, MD as Consulting Physician (Obstetrics and Gynecology) Magrinat, Valentino Hue, MD (Inactive) as Consulting Physician (Oncology) Bevelyn Ngo, NP as Nurse Practitioner (Pulmonary Disease) Dominica Severin, MD as Consulting Physician (Orthopedic Surgery)   Outpatient Medications Prior to Visit  Medication Sig Note   apixaban (ELIQUIS) 5 MG TABS tablet Take 1 tablet (5 mg total) by mouth  2 (two) times daily.    Ascorbic Acid (VITAMIN C) 1000 MG tablet Take 1,000 mg by mouth daily.    Bempedoic Acid-Ezetimibe (NEXLIZET) 180-10 MG TABS Take 1 tablet by mouth daily.    Calcium Carb-Cholecalciferol (CALCIUM 500+D PO) Take 1 tablet by mouth daily.     Coenzyme Q10 (COQ10) 100 MG CAPS Take 100 mg by mouth daily.     Cyanocobalamin (B-12 PO) Take by mouth.    metoprolol tartrate (LOPRESSOR) 25 MG tablet Take 0.5 tablets (12.5 mg total) by mouth 2 (two) times daily. 09/24/2023: Taking 1 tablet BID   rosuvastatin (CRESTOR) 40 MG tablet Take 1 tablet (40 mg total) by mouth daily.    VITAMIN D PO Take 5,000 Int'l Units by mouth daily.    [DISCONTINUED] Ascorbic Acid (VITAMIN C) 100 MG tablet Take 100 mg by mouth  daily.    [DISCONTINUED] Multiple Vitamin (MULTIVITAMIN ADULT PO) Take 1 tablet by mouth daily.    No facility-administered medications prior to visit.  Optho-Dr. Marchelle Gearing Dentist-Dr. Sherrie Mustache Derm-Dr. Roderic Scarce Cards-Dr. O'Neal [facility]    Review of Systems  All other systems reviewed and are negative.   Family and social history as well as health maintenance and immunizations was reviewed.     Objective:    BP 118/74   Pulse (!) 50   Ht 5\' 11"  (1.803 m)   Wt 162 lb 9.6 oz (73.8 kg)   SpO2 99%   BMI 22.68 kg/m    Physical Exam  Alert and in no distress. Tympanic membranes and canals are normal. Pharyngeal area is normal. Neck is supple without adenopathy or thyromegaly. Cardiac exam shows a regular sinus rhythm without murmurs or gallops. Lungs are clear to auscultation.       Assessment & Plan:     Routine general medical examination at a health care facility - Plan: CBC with Differential/Platelet, Comprehensive metabolic panel  At high risk for breast cancer - Plan: MR BREAST BILATERAL W CONTRAST  Mixed hyperlipidemia - Plan: Lipid panel  Other emphysema (HCC)  Paroxysmal atrial fibrillation (HCC)  Hx-TIA (transient ischemic attack)  Need for vaccination against Streptococcus pneumoniae - Plan: Pneumococcal conjugate vaccine 20-valent (Prevnar 20)  History of hysterectomy  Lymphocytic colitis  Vitamin D deficiency - Plan: VITAMIN D 25 Hydroxy (Vit-D Deficiency, Fractures)  History of colonic polyps  She will follow-up with cardiology as well as GI.  With her history of risk for breast cancer I think getting an MRI at this point is certainly reasonable.  Follow-up here in 1 year. Return in about 1 year (around 09/23/2024).      Sharlot Gowda, MD

## 2023-09-25 ENCOUNTER — Encounter: Payer: Self-pay | Admitting: Family Medicine

## 2023-09-25 ENCOUNTER — Telehealth: Payer: Self-pay | Admitting: *Deleted

## 2023-09-25 LAB — CBC WITH DIFFERENTIAL/PLATELET
Basophils Absolute: 0.1 10*3/uL (ref 0.0–0.2)
Basos: 1 %
EOS (ABSOLUTE): 0.1 10*3/uL (ref 0.0–0.4)
Eos: 1 %
Hematocrit: 43.9 % (ref 34.0–46.6)
Hemoglobin: 14.8 g/dL (ref 11.1–15.9)
Immature Grans (Abs): 0 10*3/uL (ref 0.0–0.1)
Immature Granulocytes: 0 %
Lymphocytes Absolute: 2.3 10*3/uL (ref 0.7–3.1)
Lymphs: 38 %
MCH: 33.9 pg — ABNORMAL HIGH (ref 26.6–33.0)
MCHC: 33.7 g/dL (ref 31.5–35.7)
MCV: 101 fL — ABNORMAL HIGH (ref 79–97)
Monocytes Absolute: 0.5 10*3/uL (ref 0.1–0.9)
Monocytes: 8 %
Neutrophils Absolute: 3.2 10*3/uL (ref 1.4–7.0)
Neutrophils: 52 %
Platelets: 144 10*3/uL — ABNORMAL LOW (ref 150–450)
RBC: 4.37 x10E6/uL (ref 3.77–5.28)
RDW: 11.5 % — ABNORMAL LOW (ref 11.7–15.4)
WBC: 6.1 10*3/uL (ref 3.4–10.8)

## 2023-09-25 LAB — LIPID PANEL
Chol/HDL Ratio: 2.4 ratio (ref 0.0–4.4)
Cholesterol, Total: 161 mg/dL (ref 100–199)
HDL: 66 mg/dL (ref >39)
LDL Chol Calc (NIH): 81 mg/dL (ref 0–99)
Triglycerides: 71 mg/dL (ref 0–149)
VLDL Cholesterol Cal: 14 mg/dL (ref 5–40)

## 2023-09-25 LAB — CMP14+EGFR
ALT: 19 IU/L (ref 0–32)
AST: 25 IU/L (ref 0–40)
Albumin: 4.7 g/dL (ref 3.9–4.9)
Alkaline Phosphatase: 39 IU/L — ABNORMAL LOW (ref 44–121)
BUN/Creatinine Ratio: 17 (ref 12–28)
BUN: 12 mg/dL (ref 8–27)
Bilirubin Total: 0.7 mg/dL (ref 0.0–1.2)
CO2: 19 mmol/L — ABNORMAL LOW (ref 20–29)
Calcium: 9.4 mg/dL (ref 8.7–10.3)
Chloride: 105 mmol/L (ref 96–106)
Creatinine, Ser: 0.71 mg/dL (ref 0.57–1.00)
Globulin, Total: 2.3 g/dL (ref 1.5–4.5)
Glucose: 81 mg/dL (ref 70–99)
Potassium: 4.3 mmol/L (ref 3.5–5.2)
Sodium: 140 mmol/L (ref 134–144)
Total Protein: 7 g/dL (ref 6.0–8.5)
eGFR: 95 mL/min/{1.73_m2} (ref >59)

## 2023-09-25 LAB — VITAMIN D 25 HYDROXY (VIT D DEFICIENCY, FRACTURES): Vit D, 25-Hydroxy: 41.2 ng/mL (ref 30.0–100.0)

## 2023-09-25 NOTE — Telephone Encounter (Signed)
 Copied from CRM [id number]. Topic: Referral - Status >> Sep 25, 2023  9:25 AM Carlatta H wrote: Reason for CRM: MRI order was put in for Breast//The order needs to say Mri breast bilateral with and without contrast//

## 2023-09-26 ENCOUNTER — Ambulatory Visit: Payer: BC Managed Care – PPO | Attending: Cardiovascular Disease | Admitting: Cardiovascular Disease

## 2023-09-26 ENCOUNTER — Ambulatory Visit: Attending: Cardiovascular Disease

## 2023-09-26 ENCOUNTER — Encounter: Payer: Self-pay | Admitting: Cardiovascular Disease

## 2023-09-26 ENCOUNTER — Other Ambulatory Visit: Payer: Self-pay | Admitting: *Deleted

## 2023-09-26 VITALS — BP 108/72 | HR 52 | Ht 71.0 in | Wt 163.0 lb

## 2023-09-26 DIAGNOSIS — I480 Paroxysmal atrial fibrillation: Secondary | ICD-10-CM

## 2023-09-26 DIAGNOSIS — I2510 Atherosclerotic heart disease of native coronary artery without angina pectoris: Secondary | ICD-10-CM | POA: Diagnosis not present

## 2023-09-26 DIAGNOSIS — E782 Mixed hyperlipidemia: Secondary | ICD-10-CM | POA: Diagnosis not present

## 2023-09-26 DIAGNOSIS — Z9189 Other specified personal risk factors, not elsewhere classified: Secondary | ICD-10-CM

## 2023-09-26 NOTE — Patient Instructions (Signed)
 Medication Instructions:  Your physician recommends that you continue on your current medications as directed. Please refer to the Current Medication list given to you today.    *If you need a refill on your cardiac medications before your next appointment, please call your pharmacy*   Lab Work: None    If you have labs (blood work) drawn today and your tests are completely normal, you will receive your results only by: MyChart Message (if you have MyChart) OR A paper copy in the mail If you have any lab test that is abnormal or we need to change your treatment, we will call you to review the results.   Testing/Procedures:  Christena Deem- Long Term Monitor Instructions  Your physician has requested you wear a ZIO patch monitor for 14 days.  This is a single patch monitor. [employer] supplies one patch monitor per enrollment. Additional stickers are not available. Please do not apply patch if you will be having a Nuclear Stress Test,  Echocardiogram, Cardiac CT, MRI, or Chest Xray during the period you would be wearing the  monitor. The patch cannot be worn during these tests. You cannot remove and re-apply the  ZIO XT patch monitor.  Your ZIO patch monitor will be mailed 3 day USPS to your address on file. It may take 3-5 days  to receive your monitor after you have been enrolled.  Once you have received your monitor, please review the enclosed instructions. Your monitor  has already been registered assigning a specific monitor serial # to you.  Billing and Patient Assistance Program Information  We have supplied [employer] with any of your insurance information on file for billing purposes. [employer] offers a sliding scale Patient Assistance Program for patients that do not have  insurance, or whose insurance does not completely cover the cost of the ZIO monitor.  You must apply for the Patient Assistance Program to qualify for this discounted rate.  To apply, please call [employer] at  [phone], select option 4, select option 2, ask to apply for  Patient Assistance Program. Meredeth Ide will ask your household income, and how many people  are in your household. They will quote your out-of-pocket cost based on that information.  [employer] will also be able to set up a 40-month, interest-free payment plan if needed.  Applying the monitor   Shave hair from upper left chest.  Hold abrader disc by orange tab. Rub abrader in 40 strokes over the upper left chest as  indicated in your monitor instructions.  Clean area with 4 enclosed alcohol pads. Let dry.  Apply patch as indicated in monitor instructions. Patch will be placed under collarbone on left  side of chest with arrow pointing upward.  Rub patch adhesive wings for 2 minutes. Remove white label marked "1". Remove the white  label marked "2". Rub patch adhesive wings for 2 additional minutes.  While looking in a mirror, press and release button in center of patch. A small green light will  flash 3-4 times. This will be your only indicator that the monitor has been turned on.  Do not shower for the first 24 hours. You may shower after the first 24 hours.  Press the button if you feel a symptom. You will hear a small click. Record Date, Time and  Symptom in the Patient Logbook.  When you are ready to remove the patch, follow instructions on the last 2 pages of Patient  Logbook. Stick patch monitor onto the last page of Patient  Logbook.  Place Patient Logbook in the blue and white box. Use locking tab on box and tape box closed  securely. The blue and white box has prepaid postage on it. Please place it in the mailbox as  soon as possible. Your physician should have your test results approximately 7 days after the  monitor has been mailed back to [facility].  Call [facility] Customer Care at [phone] if you have questions regarding  your ZIO XT patch monitor. Call them immediately if you see an orange light  blinking on your  monitor.  If your monitor falls off in less than 4 days, contact our Monitor department at [phone].  If your monitor becomes loose or falls off after 4 days call [employer] at [phone] for  suggestions on securing your monitor    Follow-Up: At [facility], you and your health needs are our priority.  As part of our continuing mission to provide you with exceptional heart care, we have created designated Provider Care Teams.  These Care Teams include your primary Cardiologist (physician) and Advanced Practice Providers (APPs -  Physician Assistants and Nurse Practitioners) who all work together to provide you with the care you need, when you need it.  We recommend signing up for the patient portal called "MyChart".  Sign up information is provided on this After Visit Summary.  MyChart is used to connect with patients for Virtual Visits (Telemedicine).  Patients are able to view lab/test results, encounter notes, upcoming appointments, etc.  Non-urgent messages can be sent to your provider as well.   To learn more about what you can do with MyChart, go to [email or web address].    Your next appointment:   1 year(s)  The format for your next appointment:   In Person  Provider:   Lennie Odor, MD     Other Instructions

## 2023-09-26 NOTE — Progress Notes (Unsigned)
 Enrolled patient for a 14 day Zio XT  monitor to be mailed to patients home

## 2023-09-26 NOTE — Telephone Encounter (Signed)
 Order changed.

## 2023-10-06 DIAGNOSIS — T63331A Toxic effect of venom of brown recluse spider, accidental (unintentional), initial encounter: Secondary | ICD-10-CM | POA: Diagnosis not present

## 2023-10-09 ENCOUNTER — Other Ambulatory Visit: Payer: Self-pay

## 2023-10-09 MED ORDER — ROSUVASTATIN CALCIUM 40 MG PO TABS: 40.0000 mg | ORAL_TABLET | Freq: Every day | ORAL | 3 refills | Status: AC

## 2023-10-24 DIAGNOSIS — I480 Paroxysmal atrial fibrillation: Secondary | ICD-10-CM | POA: Diagnosis not present

## 2023-10-27 DIAGNOSIS — I480 Paroxysmal atrial fibrillation: Secondary | ICD-10-CM

## 2023-10-30 ENCOUNTER — Encounter: Payer: Self-pay | Admitting: Cardiovascular Disease

## 2023-11-02 ENCOUNTER — Ambulatory Visit
Admission: RE | Admit: 2023-11-02 | Discharge: 2023-11-02 | Disposition: A | Discharge status: Home / Self Care | Source: Ambulatory Visit | Attending: Family Medicine | Admitting: Family Medicine

## 2023-11-02 DIAGNOSIS — Z9189 Other specified personal risk factors, not elsewhere classified: Secondary | ICD-10-CM

## 2023-11-02 DIAGNOSIS — Z803 Family history of malignant neoplasm of breast: Secondary | ICD-10-CM | POA: Diagnosis not present

## 2023-11-02 DIAGNOSIS — Z1239 Encounter for other screening for malignant neoplasm of breast: Secondary | ICD-10-CM | POA: Diagnosis not present

## 2023-11-02 MED ORDER — GADOPICLENOL 0.5 MMOL/ML IV SOLN
7.0000 mL | Freq: Once | INTRAVENOUS | Status: AC | PRN
Start: 2023-11-02 — End: 2023-11-02
  Administered 2023-11-02: 7 mL via INTRAVENOUS

## 2023-11-04 ENCOUNTER — Telehealth: Payer: Self-pay | Admitting: Pharmacy Technician

## 2023-11-04 NOTE — Telephone Encounter (Signed)
 Pharmacy Patient Advocate Encounter  Received notification from [facility] that Prior Authorization for nexlizet  has been resolved  Lf 09/07/23 90ds

## 2023-11-12 ENCOUNTER — Other Ambulatory Visit: Payer: Self-pay

## 2023-11-12 MED ORDER — NEXLIZET 180-10 MG PO TABS: 1.0000 | ORAL_TABLET | Freq: Every day | ORAL | 3 refills | Status: DC

## 2023-12-01 DIAGNOSIS — L02611 Cutaneous abscess of right foot: Secondary | ICD-10-CM | POA: Diagnosis not present

## 2023-12-01 DIAGNOSIS — L03115 Cellulitis of right lower limb: Secondary | ICD-10-CM | POA: Diagnosis not present

## 2023-12-13 DIAGNOSIS — L570 Actinic keratosis: Secondary | ICD-10-CM | POA: Diagnosis not present

## 2023-12-13 DIAGNOSIS — L700 Acne vulgaris: Secondary | ICD-10-CM | POA: Diagnosis not present

## 2023-12-23 DIAGNOSIS — Z01419 Encounter for gynecological examination (general) (routine) without abnormal findings: Secondary | ICD-10-CM | POA: Diagnosis not present

## 2023-12-23 DIAGNOSIS — N958 Other specified menopausal and perimenopausal disorders: Secondary | ICD-10-CM | POA: Diagnosis not present

## 2023-12-23 DIAGNOSIS — N838 Other noninflammatory disorders of ovary, fallopian tube and broad ligament: Secondary | ICD-10-CM | POA: Diagnosis not present

## 2023-12-23 DIAGNOSIS — N941 Unspecified dyspareunia: Secondary | ICD-10-CM | POA: Diagnosis not present

## 2023-12-23 DIAGNOSIS — Z1339 Encounter for screening examination for other mental health and behavioral disorders: Secondary | ICD-10-CM | POA: Diagnosis not present

## 2023-12-31 DIAGNOSIS — N838 Other noninflammatory disorders of ovary, fallopian tube and broad ligament: Secondary | ICD-10-CM | POA: Diagnosis not present

## 2023-12-31 DIAGNOSIS — N941 Unspecified dyspareunia: Secondary | ICD-10-CM | POA: Diagnosis not present

## 2023-12-31 DIAGNOSIS — N958 Other specified menopausal and perimenopausal disorders: Secondary | ICD-10-CM | POA: Diagnosis not present

## 2024-01-01 ENCOUNTER — Encounter: Payer: Self-pay | Admitting: Obstetrics and Gynecology

## 2024-01-08 ENCOUNTER — Other Ambulatory Visit: Payer: Self-pay | Admitting: Obstetrics and Gynecology

## 2024-01-08 ENCOUNTER — Encounter: Payer: Self-pay | Admitting: Obstetrics and Gynecology

## 2024-01-08 DIAGNOSIS — M5442 Lumbago with sciatica, left side: Secondary | ICD-10-CM | POA: Diagnosis not present

## 2024-01-08 DIAGNOSIS — N838 Other noninflammatory disorders of ovary, fallopian tube and broad ligament: Secondary | ICD-10-CM

## 2024-01-08 DIAGNOSIS — N839 Noninflammatory disorder of ovary, fallopian tube and broad ligament, unspecified: Secondary | ICD-10-CM

## 2024-01-09 ENCOUNTER — Encounter: Payer: Self-pay | Admitting: Obstetrics and Gynecology

## 2024-01-14 ENCOUNTER — Ambulatory Visit
Admission: RE | Admit: 2024-01-14 | Discharge: 2024-01-14 | Disposition: A | Discharge status: Home / Self Care | Source: Ambulatory Visit | Attending: Obstetrics and Gynecology | Admitting: Obstetrics and Gynecology

## 2024-01-14 DIAGNOSIS — N839 Noninflammatory disorder of ovary, fallopian tube and broad ligament, unspecified: Secondary | ICD-10-CM | POA: Diagnosis not present

## 2024-01-14 DIAGNOSIS — N838 Other noninflammatory disorders of ovary, fallopian tube and broad ligament: Secondary | ICD-10-CM

## 2024-01-14 MED ORDER — IOPAMIDOL (ISOVUE-300) INJECTION 61%
100.0000 mL | Freq: Once | INTRAVENOUS | Status: AC | PRN
  Administered 2024-01-14: 100 mL via INTRAVENOUS

## 2024-01-22 DIAGNOSIS — M8589 Other specified disorders of bone density and structure, multiple sites: Secondary | ICD-10-CM | POA: Diagnosis not present

## 2024-01-22 DIAGNOSIS — Z780 Asymptomatic menopausal state: Secondary | ICD-10-CM | POA: Diagnosis not present

## 2024-02-07 ENCOUNTER — Other Ambulatory Visit: Payer: Self-pay | Admitting: Nurse Practitioner

## 2024-02-07 DIAGNOSIS — I480 Paroxysmal atrial fibrillation: Secondary | ICD-10-CM

## 2024-02-07 NOTE — Telephone Encounter (Signed)
 Eliquis  5mg  refill request received. Patient is 63 years old, weight-73.9kg, Crea-0.71 on 09/24/23, Diagnosis-Afib, and last seen by Dr. Barbaraann on 09/26/23. Dose is appropriate based on dosing criteria. Will send in refill to requested pharmacy.

## 2024-02-12 ENCOUNTER — Encounter: Payer: Self-pay | Admitting: Cardiovascular Disease

## 2024-02-13 ENCOUNTER — Telehealth: Payer: Self-pay

## 2024-02-13 ENCOUNTER — Other Ambulatory Visit (HOSPITAL_COMMUNITY): Payer: Self-pay

## 2024-02-13 NOTE — Telephone Encounter (Addendum)
 Laurie Schmidt enrolled for Nexlizet . Will mail application for Eliquis .

## 2024-02-13 NOTE — Telephone Encounter (Signed)
  Disregarding application.

## 2024-02-13 NOTE — Telephone Encounter (Signed)
 Pharmacy Patient Advocate Encounter   Insurance verification completed.   The patient is insured through [facility] MEDICARE   Ran test claim for NEXLIZET . Currently a quantity of 30 is a $195.06 day supply and the co-pay is $195.06 . The current 30 day co-pay is, $195.06.  No PA needed at this time.   This test claim was processed through [facility]- copay amounts may vary at other pharmacies due to pharmacy/plan contracts, or as the patient moves through the different stages of their insurance plan.

## 2024-02-13 NOTE — Telephone Encounter (Signed)
 PAP app needed. Staff onsite again on 02/14/24 and will mail application.

## 2024-02-13 NOTE — Telephone Encounter (Signed)
 Patient Advocate Encounter   The patient was approved for a [employer] grant that will help cover the cost of NEXLIZET  Total amount awarded, $2,500.  Effective: 01/14/24 - 01/12/25   APW:[id number] ERW:[id number] Hmnle:[id number] PI:[id number]   Pharmacy provided with approval and processing information.   Ileana Lehmann, CPhT  Pharmacy Patient Advocate Specialist  Direct Number: [phone] Fax: [phone]

## 2024-03-10 ENCOUNTER — Other Ambulatory Visit: Payer: Self-pay | Admitting: Medical Genetics

## 2024-03-13 ENCOUNTER — Encounter: Payer: Self-pay | Admitting: Cardiovascular Disease

## 2024-03-13 NOTE — Telephone Encounter (Signed)
 Spoke to patient, will do assessment on other insurance preferred options such as PCSK9i.

## 2024-03-17 ENCOUNTER — Other Ambulatory Visit (HOSPITAL_COMMUNITY): Payer: Self-pay

## 2024-03-17 MED ORDER — NEXLIZET 180-10 MG PO TABS
1.0000 | ORAL_TABLET | Freq: Every day | ORAL | 3 refills | Status: AC
  Filled 2024-03-17: qty 30, 30d supply, fill #0
  Filled 2024-04-16 – 2024-04-20 (×2): qty 30, 30d supply, fill #1
  Filled 2024-05-16: qty 30, 30d supply, fill #2
  Filled 2024-06-15 (×2): qty 30, 30d supply, fill #3
  Filled 2024-07-15: qty 30, 30d supply, fill #4
  Filled 2024-08-11: qty 30, 30d supply, fill #5

## 2024-03-17 NOTE — Telephone Encounter (Signed)
 Hi, looks like the drug is non form on patient's plan, however it is being covered with patient cost at 30 days being $195.06. This patient is Medicare so we could potentially cover the Nexlizet  copay with a [employer] grant. Please advise if trying a [employer] grant for Nexlizet  is appropriate or if you would still like to proceed with starting PCSK9i therapy.

## 2024-03-19 ENCOUNTER — Other Ambulatory Visit (HOSPITAL_COMMUNITY): Payer: Self-pay

## 2024-03-19 DIAGNOSIS — M26601 Right temporomandibular joint disorder, unspecified: Secondary | ICD-10-CM | POA: Diagnosis not present

## 2024-03-19 DIAGNOSIS — E7800 Pure hypercholesterolemia, unspecified: Secondary | ICD-10-CM | POA: Diagnosis not present

## 2024-03-19 DIAGNOSIS — I482 Chronic atrial fibrillation, unspecified: Secondary | ICD-10-CM | POA: Diagnosis not present

## 2024-03-19 DIAGNOSIS — I10 Essential (primary) hypertension: Secondary | ICD-10-CM | POA: Diagnosis not present

## 2024-04-17 ENCOUNTER — Other Ambulatory Visit (HOSPITAL_COMMUNITY): Payer: Self-pay

## 2024-04-17 ENCOUNTER — Other Ambulatory Visit: Payer: Self-pay

## 2024-04-17 ENCOUNTER — Telehealth: Payer: Self-pay

## 2024-04-17 NOTE — Telephone Encounter (Signed)
 Pharmacy Patient Advocate Encounter   Received notification from Patient Pharmacy that prior authorization for NEXLIZET  is required/requested.   Insurance verification completed.   The patient is insured through [facility].   Per test claim: PA required; PA submitted to above mentioned insurance via Latent Key/confirmation #/[id number] [facility] Status is pending

## 2024-04-20 ENCOUNTER — Other Ambulatory Visit (HOSPITAL_COMMUNITY): Payer: Self-pay

## 2024-04-20 ENCOUNTER — Other Ambulatory Visit: Payer: Self-pay

## 2024-04-20 DIAGNOSIS — H40033 Anatomical narrow angle, bilateral: Secondary | ICD-10-CM | POA: Diagnosis not present

## 2024-04-20 DIAGNOSIS — H2513 Age-related nuclear cataract, bilateral: Secondary | ICD-10-CM | POA: Diagnosis not present

## 2024-04-21 ENCOUNTER — Other Ambulatory Visit (HOSPITAL_COMMUNITY): Payer: Self-pay

## 2024-04-22 NOTE — Telephone Encounter (Signed)
 Pharmacy Patient Advocate Encounter  Received notification from [facility] that Prior Authorization for NEXLIZET  has been APPROVED from 04/17/24 to 04/17/25

## 2024-05-17 ENCOUNTER — Other Ambulatory Visit (HOSPITAL_COMMUNITY): Payer: Self-pay

## 2024-05-18 ENCOUNTER — Ambulatory Visit
Admission: RE | Admit: 2024-05-18 | Discharge: 2024-05-18 | Disposition: A | Source: Ambulatory Visit | Attending: Acute Care | Admitting: Acute Care

## 2024-05-18 DIAGNOSIS — F17210 Nicotine dependence, cigarettes, uncomplicated: Secondary | ICD-10-CM

## 2024-05-18 DIAGNOSIS — Z87891 Personal history of nicotine dependence: Secondary | ICD-10-CM

## 2024-05-18 DIAGNOSIS — Z122 Encounter for screening for malignant neoplasm of respiratory organs: Secondary | ICD-10-CM

## 2024-05-25 ENCOUNTER — Other Ambulatory Visit: Payer: Self-pay | Admitting: Acute Care

## 2024-05-25 DIAGNOSIS — Z122 Encounter for screening for malignant neoplasm of respiratory organs: Secondary | ICD-10-CM

## 2024-05-25 DIAGNOSIS — Z87891 Personal history of nicotine dependence: Secondary | ICD-10-CM

## 2024-06-09 ENCOUNTER — Other Ambulatory Visit: Payer: Self-pay | Admitting: Medical Genetics

## 2024-06-09 DIAGNOSIS — Z006 Encounter for examination for normal comparison and control in clinical research program: Secondary | ICD-10-CM

## 2024-06-15 ENCOUNTER — Other Ambulatory Visit (HOSPITAL_COMMUNITY): Payer: Self-pay

## 2024-06-17 LAB — GENECONNECT MOLECULAR SCREEN: Genetic Analysis Overall Interpretation: NEGATIVE

## 2024-06-26 DIAGNOSIS — M25561 Pain in right knee: Secondary | ICD-10-CM | POA: Diagnosis not present

## 2024-07-15 ENCOUNTER — Other Ambulatory Visit: Payer: Self-pay

## 2024-07-16 ENCOUNTER — Other Ambulatory Visit: Payer: Self-pay

## 2024-08-11 ENCOUNTER — Other Ambulatory Visit: Payer: Self-pay

## 2024-09-24 ENCOUNTER — Encounter: Payer: Self-pay | Admitting: Family Medicine

## 2024-10-19 ENCOUNTER — Ambulatory Visit: Admitting: Cardiovascular Disease
